# Patient Record
Sex: Male | Born: 1990 | Race: White | Hispanic: No | Marital: Single | Smoking: Current some day smoker
Health system: Southern US, Community
[De-identification: ages and names within clinical notes are randomized; demographics above are authoritative.]

## PROBLEM LIST (undated history)

## (undated) DIAGNOSIS — Z8489 Family history of other specified conditions: Secondary | ICD-10-CM

## (undated) DIAGNOSIS — S42209A Unspecified fracture of upper end of unspecified humerus, initial encounter for closed fracture: Secondary | ICD-10-CM

## (undated) DIAGNOSIS — S92912A Unspecified fracture of left toe(s), initial encounter for closed fracture: Secondary | ICD-10-CM

## (undated) DIAGNOSIS — I8390 Asymptomatic varicose veins of unspecified lower extremity: Secondary | ICD-10-CM

## (undated) HISTORY — PX: FRACTURE SURGERY: SHX138

## (undated) HISTORY — PX: WISDOM TOOTH EXTRACTION: SHX21

---
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
## 2014-01-04 ENCOUNTER — Encounter (HOSPITAL_COMMUNITY)
Admission: RE | Admit: 2014-01-04 | Discharge: 2014-01-04 | Disposition: A | Discharge status: Home / Self Care | Payer: PRIVATE HEALTH INSURANCE | Source: Ambulatory Visit | Attending: Orthopedic Surgery | Admitting: Orthopedic Surgery

## 2014-01-04 ENCOUNTER — Encounter (HOSPITAL_COMMUNITY): Payer: Self-pay

## 2014-01-04 DIAGNOSIS — S46819A Strain of other muscles, fascia and tendons at shoulder and upper arm level, unspecified arm, initial encounter: Secondary | ICD-10-CM | POA: Diagnosis not present

## 2014-01-04 DIAGNOSIS — Z5333 Arthroscopic surgical procedure converted to open procedure: Secondary | ICD-10-CM | POA: Diagnosis not present

## 2014-01-04 DIAGNOSIS — S43499A Other sprain of unspecified shoulder joint, initial encounter: Secondary | ICD-10-CM | POA: Diagnosis not present

## 2014-01-04 DIAGNOSIS — S42213A Unspecified displaced fracture of surgical neck of unspecified humerus, initial encounter for closed fracture: Secondary | ICD-10-CM | POA: Diagnosis present

## 2014-01-04 DIAGNOSIS — F17200 Nicotine dependence, unspecified, uncomplicated: Secondary | ICD-10-CM | POA: Diagnosis not present

## 2014-01-04 HISTORY — DX: Family history of other specified conditions: Z84.89

## 2014-01-04 HISTORY — DX: Unspecified fracture of left toe(s), initial encounter for closed fracture: S92.912A

## 2014-01-04 HISTORY — DX: Asymptomatic varicose veins of unspecified lower extremity: I83.90

## 2014-01-04 HISTORY — DX: Unspecified fracture of upper end of unspecified humerus, initial encounter for closed fracture: S42.209A

## 2014-01-04 NOTE — Pre-Procedure Instructions (Signed)
[name]  01/04/2014   Your procedure is scheduled on:  Monday, January 07, 2014 at 2:30 PM  [facility] to [facility] at 12:30 PM.  Call this number if you have problems the morning of surgery: [phone]   Remember:   Do not eat food or drink liquids after midnight Sunday, January 06, 2014   Take these medicines the morning of surgery with A SIP OF WATER:  If needed: HYDROcodone  (NORCO/VICODIN) for pain Stop taking Aspirin, vitamins, and herbal medications. Do not take any NSAIDs ie: Ibuprofen, Advil, Naproxen or any medication containing Aspirin.  Do not wear jewelry, make-up or nail polish.  Do not wear lotions, powders, or perfumes. You may NOT wear deodorant.  Do not shave 48 hours prior to surgery. Men may shave face and neck.  Do not bring valuables to the hospital.  [facility] is not responsible for any belongings or valuables.               Contacts, dentures or bridgework may not be worn into surgery.  Leave suitcase in the car. After surgery it may be brought to your room.  For patients admitted to the hospital, discharge time is determined by your treatment team.               Patients discharged the day of surgery will not be allowed to drive home.  Name and phone number of your driver:   Special Instructions:  Special Instructions:Special Instructions: [facility] - Preparing for Surgery  Before surgery, you can play an important role.  Because skin is not sterile, your skin needs to be as free of germs as possible.  You can reduce the number of germs on you skin by washing with CHG (chlorahexidine gluconate) soap before surgery.  CHG is an antiseptic cleaner which kills germs and bonds with the skin to continue killing germs even after washing.  Please DO NOT use if you have an allergy to CHG or antibacterial soaps.  If your skin becomes reddened/irritated stop using the CHG and inform your nurse when you arrive at Short Stay.  Do not shave  (including legs and underarms) for at least 48 hours prior to the first CHG shower.  You may shave your face.  Please follow these instructions carefully:   1.  Shower with CHG Soap the night before surgery and the morning of Surgery.  2.  If you choose to wash your hair, wash your hair first as usual with your normal shampoo.  3.  After you shampoo, rinse your hair and body thoroughly to remove the Shampoo.  4.  Use CHG as you would any other liquid soap.  You can apply chg directly  to the skin and wash gently with scrungie or a clean washcloth.  5.  Apply the CHG Soap to your body ONLY FROM THE NECK DOWN.  Do not use on open wounds or open sores.  Avoid contact with your eyes, ears, mouth and genitals (private parts).  Wash genitals (private parts) with your normal soap.  6.  Wash thoroughly, paying special attention to the area where your surgery will be performed.  7.  Thoroughly rinse your body with warm water from the neck down.  8.  DO NOT shower/wash with your normal soap after using and rinsing off the CHG Soap.  9.  Pat yourself dry with a clean towel.            10 .  Wear  clean pajamas.            11.  Place clean sheets on your bed the night of your first shower and do not sleep with pets.  Day of Surgery  Do not apply any lotions/deodorants the morning of surgery.  Please wear clean clothes to the hospital/surgery center.   Please read over the following fact sheets that you were given: Pain Booklet, Coughing and Deep Breathing and Surgical Site Infection Prevention
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
## 2014-01-07 ENCOUNTER — Encounter (HOSPITAL_COMMUNITY): Payer: PRIVATE HEALTH INSURANCE | Admitting: Vascular Surgery

## 2014-01-07 ENCOUNTER — Observation Stay (HOSPITAL_COMMUNITY): Payer: PRIVATE HEALTH INSURANCE

## 2014-01-07 ENCOUNTER — Ambulatory Visit (HOSPITAL_COMMUNITY): Payer: PRIVATE HEALTH INSURANCE | Admitting: Anesthesiology

## 2014-01-07 ENCOUNTER — Observation Stay (HOSPITAL_COMMUNITY)
Admission: RE | Admit: 2014-01-07 | Discharge: 2014-01-09 | Disposition: A | Discharge status: Home / Self Care | Payer: PRIVATE HEALTH INSURANCE | Source: Ambulatory Visit | Attending: Orthopedic Surgery | Admitting: Orthopedic Surgery

## 2014-01-07 ENCOUNTER — Encounter (HOSPITAL_COMMUNITY)
Admission: RE | Disposition: A | Discharge status: Home / Self Care | Payer: Self-pay | Source: Ambulatory Visit | Attending: Orthopedic Surgery

## 2014-01-07 ENCOUNTER — Encounter (HOSPITAL_COMMUNITY): Payer: Self-pay | Admitting: Anesthesiology

## 2014-01-07 DIAGNOSIS — F17200 Nicotine dependence, unspecified, uncomplicated: Secondary | ICD-10-CM | POA: Insufficient documentation

## 2014-01-07 DIAGNOSIS — S43499A Other sprain of unspecified shoulder joint, initial encounter: Secondary | ICD-10-CM | POA: Insufficient documentation

## 2014-01-07 DIAGNOSIS — S42213A Unspecified displaced fracture of surgical neck of unspecified humerus, initial encounter for closed fracture: Principal | ICD-10-CM | POA: Insufficient documentation

## 2014-01-07 DIAGNOSIS — Z5333 Arthroscopic surgical procedure converted to open procedure: Secondary | ICD-10-CM | POA: Insufficient documentation

## 2014-01-07 DIAGNOSIS — S46819A Strain of other muscles, fascia and tendons at shoulder and upper arm level, unspecified arm, initial encounter: Secondary | ICD-10-CM

## 2014-01-07 DIAGNOSIS — S42209A Unspecified fracture of upper end of unspecified humerus, initial encounter for closed fracture: Secondary | ICD-10-CM

## 2014-01-07 DIAGNOSIS — S42202A Unspecified fracture of upper end of left humerus, initial encounter for closed fracture: Secondary | ICD-10-CM

## 2014-01-07 HISTORY — PX: SHOULDER ARTHROSCOPY: SHX128

## 2014-01-07 HISTORY — PX: ORIF HUMERUS FRACTURE: SHX2126

## 2014-01-07 LAB — BASIC METABOLIC PANEL
ANION GAP: 13 (ref 5–15)
BUN: 11 mg/dL (ref 6–23)
CHLORIDE: 97 meq/L (ref 96–112)
CO2: 27 meq/L (ref 19–32)
Calcium: 9.3 mg/dL (ref 8.4–10.5)
Creatinine, Ser: 0.85 mg/dL (ref 0.50–1.35)
GFR calc Af Amer: >90 mL/min (ref >90)
GFR calc non Af Amer: >90 mL/min (ref >90)
Glucose, Bld: 90 mg/dL (ref 70–99)
POTASSIUM: 3.8 meq/L (ref 3.7–5.3)
SODIUM: 137 meq/L (ref 137–147)

## 2014-01-07 LAB — CBC
HCT: 38.4 % — ABNORMAL LOW (ref 39.0–52.0)
HEMOGLOBIN: 13.6 g/dL (ref 13.0–17.0)
MCH: 31 pg (ref 26.0–34.0)
MCHC: 35.4 g/dL (ref 30.0–36.0)
MCV: 87.5 fL (ref 78.0–100.0)
PLATELETS: 232 10*3/uL (ref 150–400)
RBC: 4.39 MIL/uL (ref 4.22–5.81)
RDW: 11.8 % (ref 11.5–15.5)
WBC: 8.4 10*3/uL (ref 4.0–10.5)

## 2014-01-07 SURGERY — OPEN REDUCTION INTERNAL FIXATION (ORIF) PROXIMAL HUMERUS FRACTURE
Anesthesia: General | Site: Shoulder | Laterality: Left

## 2014-01-07 MED ORDER — HYDROMORPHONE HCL PF 1 MG/ML IJ SOLN: 0.2500 mg | INTRAMUSCULAR | Status: DC | PRN

## 2014-01-07 MED ORDER — ONDANSETRON HCL 4 MG PO TABS: 4.0000 mg | ORAL_TABLET | Freq: Four times a day (QID) | ORAL | Status: DC | PRN

## 2014-01-07 MED ORDER — METOCLOPRAMIDE HCL 5 MG/ML IJ SOLN: 5.0000 mg | Freq: Three times a day (TID) | INTRAMUSCULAR | Status: DC | PRN

## 2014-01-07 MED ORDER — ONDANSETRON HCL 4 MG/2ML IJ SOLN
INTRAMUSCULAR | Status: AC
  Filled 2014-01-07: qty 2

## 2014-01-07 MED ORDER — METOCLOPRAMIDE HCL 5 MG/ML IJ SOLN
INTRAMUSCULAR | Status: AC
  Administered 2014-01-07: 10 mg
  Filled 2014-01-07: qty 2

## 2014-01-07 MED ORDER — NEOSTIGMINE METHYLSULFATE 10 MG/10ML IV SOLN
INTRAVENOUS | Status: DC | PRN
  Administered 2014-01-07: 3 mg via INTRAVENOUS

## 2014-01-07 MED ORDER — ACETAMINOPHEN 650 MG RE SUPP: 650.0000 mg | Freq: Four times a day (QID) | RECTAL | Status: DC | PRN

## 2014-01-07 MED ORDER — ONDANSETRON HCL 4 MG/2ML IJ SOLN
4.0000 mg | Freq: Four times a day (QID) | INTRAMUSCULAR | Status: DC | PRN
  Administered 2014-01-09: 4 mg via INTRAVENOUS
  Filled 2014-01-07: qty 2

## 2014-01-07 MED ORDER — METOCLOPRAMIDE HCL 10 MG PO TABS
5.0000 mg | ORAL_TABLET | Freq: Three times a day (TID) | ORAL | Status: DC | PRN
  Administered 2014-01-08 (×2): 10 mg via ORAL
  Filled 2014-01-07 (×2): qty 1

## 2014-01-07 MED ORDER — LIDOCAINE HCL (CARDIAC) 20 MG/ML IV SOLN
INTRAVENOUS | Status: DC | PRN
  Administered 2014-01-07: 60 mg via INTRAVENOUS

## 2014-01-07 MED ORDER — MENTHOL 3 MG MT LOZG
1.0000 | LOZENGE | OROMUCOSAL | Status: DC | PRN
  Filled 2014-01-07: qty 9

## 2014-01-07 MED ORDER — METOCLOPRAMIDE HCL 10 MG/10ML PO SOLN: 10.0000 mg | Freq: Three times a day (TID) | ORAL | Status: DC

## 2014-01-07 MED ORDER — METHOCARBAMOL 500 MG PO TABS: 500.0000 mg | ORAL_TABLET | Freq: Three times a day (TID) | ORAL | Status: DC | PRN

## 2014-01-07 MED ORDER — PROPOFOL 10 MG/ML IV BOLUS
INTRAVENOUS | Status: DC | PRN
  Administered 2014-01-07: 100 mg via INTRAVENOUS

## 2014-01-07 MED ORDER — GLYCOPYRROLATE 0.2 MG/ML IJ SOLN
INTRAMUSCULAR | Status: AC
  Filled 2014-01-07: qty 3

## 2014-01-07 MED ORDER — FENTANYL CITRATE 0.05 MG/ML IJ SOLN
INTRAMUSCULAR | Status: AC
  Administered 2014-01-07: 50 ug
  Filled 2014-01-07: qty 2

## 2014-01-07 MED ORDER — HYDROMORPHONE HCL PF 1 MG/ML IJ SOLN
1.0000 mg | INTRAMUSCULAR | Status: DC | PRN
  Administered 2014-01-07 – 2014-01-09 (×14): 1 mg via INTRAVENOUS
  Filled 2014-01-07 (×13): qty 1

## 2014-01-07 MED ORDER — OXYCODONE-ACETAMINOPHEN 5-325 MG PO TABS
1.0000 | ORAL_TABLET | ORAL | Status: DC | PRN
  Administered 2014-01-07: 2 via ORAL
  Administered 2014-01-08 (×2): 1 via ORAL
  Filled 2014-01-07: qty 2
  Filled 2014-01-07 (×2): qty 1

## 2014-01-07 MED ORDER — BUPIVACAINE-EPINEPHRINE (PF) 0.25% -1:200000 IJ SOLN
INTRAMUSCULAR | Status: DC | PRN
  Administered 2014-01-07: 15 mL

## 2014-01-07 MED ORDER — SODIUM CHLORIDE 0.9 % IR SOLN
Status: DC | PRN
  Administered 2014-01-07: 3000 mL

## 2014-01-07 MED ORDER — PROPOFOL 10 MG/ML IV BOLUS
INTRAVENOUS | Status: AC
  Filled 2014-01-07: qty 20

## 2014-01-07 MED ORDER — BACITRACIN 500 UNIT/GM EX OINT
1.0000 "application " | TOPICAL_OINTMENT | Freq: Every day | CUTANEOUS | Status: DC
  Filled 2014-01-07 (×3): qty 0.9

## 2014-01-07 MED ORDER — CHLORHEXIDINE GLUCONATE 4 % EX LIQD
60.0000 mL | Freq: Once | CUTANEOUS | Status: DC
  Filled 2014-01-07: qty 60

## 2014-01-07 MED ORDER — BUPIVACAINE-EPINEPHRINE (PF) 0.25% -1:200000 IJ SOLN
INTRAMUSCULAR | Status: AC
  Filled 2014-01-07: qty 30

## 2014-01-07 MED ORDER — LIDOCAINE HCL (CARDIAC) 20 MG/ML IV SOLN
INTRAVENOUS | Status: AC
  Filled 2014-01-07: qty 5

## 2014-01-07 MED ORDER — LACTATED RINGERS IV SOLN
INTRAVENOUS | Status: DC
  Administered 2014-01-07: 14:00:00 via INTRAVENOUS

## 2014-01-07 MED ORDER — HYDROMORPHONE HCL PF 1 MG/ML IJ SOLN
INTRAMUSCULAR | Status: AC
  Filled 2014-01-07: qty 1

## 2014-01-07 MED ORDER — CEFAZOLIN SODIUM-DEXTROSE 2-3 GM-% IV SOLR
INTRAVENOUS | Status: AC
  Filled 2014-01-07: qty 50

## 2014-01-07 MED ORDER — CEFAZOLIN SODIUM-DEXTROSE 2-3 GM-% IV SOLR
2.0000 g | INTRAVENOUS | Status: AC
  Administered 2014-01-07: 2 g via INTRAVENOUS

## 2014-01-07 MED ORDER — PROMETHAZINE HCL 50 MG PO TABS: 25.0000 mg | ORAL_TABLET | Freq: Four times a day (QID) | ORAL | Status: DC | PRN

## 2014-01-07 MED ORDER — OXYCODONE-ACETAMINOPHEN 5-325 MG PO TABS: 1.0000 | ORAL_TABLET | ORAL | Status: DC | PRN

## 2014-01-07 MED ORDER — ONDANSETRON HCL 4 MG/2ML IJ SOLN
4.0000 mg | Freq: Once | INTRAMUSCULAR | Status: AC | PRN
  Administered 2014-01-07: 4 mg via INTRAVENOUS

## 2014-01-07 MED ORDER — METHOCARBAMOL 1000 MG/10ML IJ SOLN
500.0000 mg | Freq: Four times a day (QID) | INTRAVENOUS | Status: DC | PRN
  Filled 2014-01-07: qty 5

## 2014-01-07 MED ORDER — LACTATED RINGERS IV SOLN
INTRAVENOUS | Status: DC | PRN
  Administered 2014-01-07 (×2): via INTRAVENOUS

## 2014-01-07 MED ORDER — PHENOL 1.4 % MT LIQD: 1.0000 | OROMUCOSAL | Status: DC | PRN

## 2014-01-07 MED ORDER — MIDAZOLAM HCL 2 MG/2ML IJ SOLN
INTRAMUSCULAR | Status: AC
  Administered 2014-01-07: 2 mg
  Filled 2014-01-07: qty 2

## 2014-01-07 MED ORDER — 0.9 % SODIUM CHLORIDE (POUR BTL) OPTIME
TOPICAL | Status: DC | PRN
  Administered 2014-01-07: 1000 mL

## 2014-01-07 MED ORDER — NEOSTIGMINE METHYLSULFATE 10 MG/10ML IV SOLN
INTRAVENOUS | Status: AC
  Filled 2014-01-07: qty 1

## 2014-01-07 MED ORDER — ONDANSETRON HCL 4 MG/2ML IJ SOLN
INTRAMUSCULAR | Status: AC
Start: 2014-01-07 — End: 2014-01-08
  Filled 2014-01-07: qty 2

## 2014-01-07 MED ORDER — BISACODYL 10 MG RE SUPP
10.0000 mg | Freq: Every day | RECTAL | Status: DC | PRN
  Administered 2014-01-08: 10 mg via RECTAL
  Filled 2014-01-07: qty 1

## 2014-01-07 MED ORDER — GLYCOPYRROLATE 0.2 MG/ML IJ SOLN
INTRAMUSCULAR | Status: DC | PRN
  Administered 2014-01-07: 0.4 mg via INTRAVENOUS

## 2014-01-07 MED ORDER — MEPERIDINE HCL 25 MG/ML IJ SOLN
6.2500 mg | INTRAMUSCULAR | Status: DC | PRN
  Administered 2014-01-07: 12.5 mg via INTRAVENOUS

## 2014-01-07 MED ORDER — OXYCODONE HCL 5 MG PO TABS: 5.0000 mg | ORAL_TABLET | Freq: Once | ORAL | Status: DC | PRN

## 2014-01-07 MED ORDER — KETOROLAC TROMETHAMINE 30 MG/ML IJ SOLN
INTRAMUSCULAR | Status: AC
  Filled 2014-01-07: qty 1

## 2014-01-07 MED ORDER — ACETAMINOPHEN 325 MG PO TABS: 650.0000 mg | ORAL_TABLET | Freq: Four times a day (QID) | ORAL | Status: DC | PRN

## 2014-01-07 MED ORDER — METHOCARBAMOL 500 MG PO TABS
500.0000 mg | ORAL_TABLET | Freq: Four times a day (QID) | ORAL | Status: DC | PRN
  Administered 2014-01-08 – 2014-01-09 (×6): 500 mg via ORAL
  Filled 2014-01-07 (×6): qty 1

## 2014-01-07 MED ORDER — FENTANYL CITRATE 0.05 MG/ML IJ SOLN
INTRAMUSCULAR | Status: DC | PRN
  Administered 2014-01-07: 100 ug via INTRAVENOUS
  Administered 2014-01-07 (×3): 50 ug via INTRAVENOUS

## 2014-01-07 MED ORDER — OXYCODONE HCL 5 MG/5ML PO SOLN: 5.0000 mg | Freq: Once | ORAL | Status: DC | PRN

## 2014-01-07 MED ORDER — DIPHENHYDRAMINE HCL 25 MG PO CAPS: 25.0000 mg | ORAL_CAPSULE | Freq: Four times a day (QID) | ORAL | Status: DC | PRN

## 2014-01-07 MED ORDER — BUPIVACAINE-EPINEPHRINE (PF) 0.5% -1:200000 IJ SOLN
INTRAMUSCULAR | Status: DC | PRN
  Administered 2014-01-07: 30 mL via PERINEURAL

## 2014-01-07 MED ORDER — FENTANYL CITRATE 0.05 MG/ML IJ SOLN
INTRAMUSCULAR | Status: AC
  Filled 2014-01-07: qty 5

## 2014-01-07 MED ORDER — ROCURONIUM BROMIDE 100 MG/10ML IV SOLN
INTRAVENOUS | Status: DC | PRN
  Administered 2014-01-07: 50 mg via INTRAVENOUS

## 2014-01-07 MED ORDER — MEPERIDINE HCL 25 MG/ML IJ SOLN
INTRAMUSCULAR | Status: AC
  Filled 2014-01-07: qty 1

## 2014-01-07 MED ORDER — METOCLOPRAMIDE HCL 10 MG/10ML PO SOLN: 10.0000 mg | Freq: Once | ORAL | Status: DC

## 2014-01-07 MED ORDER — KETOROLAC TROMETHAMINE 30 MG/ML IJ SOLN
INTRAMUSCULAR | Status: DC | PRN
  Administered 2014-01-07: 30 mg via INTRAVENOUS

## 2014-01-07 MED ORDER — MIDAZOLAM HCL 2 MG/2ML IJ SOLN
INTRAMUSCULAR | Status: AC
  Filled 2014-01-07: qty 2

## 2014-01-07 MED ORDER — SODIUM CHLORIDE 0.9 % IV SOLN: INTRAVENOUS | Status: DC

## 2014-01-07 MED ORDER — ONDANSETRON HCL 4 MG/2ML IJ SOLN
INTRAMUSCULAR | Status: DC | PRN
  Administered 2014-01-07: 4 mg via INTRAVENOUS

## 2014-01-07 MED ORDER — CEFAZOLIN SODIUM-DEXTROSE 2-3 GM-% IV SOLR
2.0000 g | Freq: Four times a day (QID) | INTRAVENOUS | Status: AC
  Administered 2014-01-07 – 2014-01-08 (×3): 2 g via INTRAVENOUS
  Filled 2014-01-07 (×3): qty 50

## 2014-01-07 SURGICAL SUPPLY — 87 items
ANCHOR ALL-SUT FLEX 1.3 Y-KNOT (Anchor) ×3 IMPLANT
BIT DRILL 1.3M DISPOSABLE (BIT) ×3 IMPLANT
BIT DRILL 4 FAST STEP (BIT) ×3 IMPLANT
BIT DRILL 4 LONG FAST STEP (BIT) ×3 IMPLANT
BIT DRILL 4 SHORT FAST STEP (BIT) ×3 IMPLANT
BLADE CUDA 4.2 (BLADE) IMPLANT
BLADE SURG 11 STRL SS (BLADE) ×3 IMPLANT
BUR OVAL 4.0 (BURR) ×3 IMPLANT
CLOSURE WOUND 1/2 X4 (GAUZE/BANDAGES/DRESSINGS) ×1
COVER SURGICAL LIGHT HANDLE (MISCELLANEOUS) ×3 IMPLANT
DRAPE INCISE IOBAN 66X45 STRL (DRAPES) ×6 IMPLANT
DRAPE STERI 35X30 U-POUCH (DRAPES) ×3 IMPLANT
DRAPE U-SHAPE 47X51 STRL (DRAPES) ×3 IMPLANT
DRESSING ADAPTIC 1/2  N-ADH (PACKING) ×3 IMPLANT
DRILL BIT 2.8MM DB28 (MISCELLANEOUS) ×3 IMPLANT
DRSG EMULSION OIL 3X3 NADH (GAUZE/BANDAGES/DRESSINGS) ×6 IMPLANT
DRSG PAD ABDOMINAL 8X10 ST (GAUZE/BANDAGES/DRESSINGS) ×6 IMPLANT
DURAPREP 26ML APPLICATOR (WOUND CARE) ×3 IMPLANT
ELECT NEEDLE TIP 2.8 STRL (NEEDLE) ×3 IMPLANT
ELECT REM PT RETURN 9FT ADLT (ELECTROSURGICAL) ×3
ELECTRODE REM PT RTRN 9FT ADLT (ELECTROSURGICAL) ×1 IMPLANT
GAUZE SPONGE 4X4 12PLY STRL (GAUZE/BANDAGES/DRESSINGS) ×3 IMPLANT
GLOVE BIOGEL PI ORTHO PRO 7.5 (GLOVE) ×2
GLOVE BIOGEL PI ORTHO PRO SZ8 (GLOVE) ×2
GLOVE ORTHO TXT STRL SZ7.5 (GLOVE) ×3 IMPLANT
GLOVE PI ORTHO PRO STRL 7.5 (GLOVE) ×1 IMPLANT
GLOVE PI ORTHO PRO STRL SZ8 (GLOVE) ×1 IMPLANT
GLOVE SURG ORTHO 8.5 STRL (GLOVE) ×3 IMPLANT
GOWN STRL REUS W/ TWL XL LVL3 (GOWN DISPOSABLE) ×4 IMPLANT
GOWN STRL REUS W/TWL XL LVL3 (GOWN DISPOSABLE) ×8
HI-FI #2 SUTURE ×3 IMPLANT
KIT BASIN OR (CUSTOM PROCEDURE TRAY) ×3 IMPLANT
KIT ROOM TURNOVER OR (KITS) ×3 IMPLANT
MANIFOLD NEPTUNE II (INSTRUMENTS) ×3 IMPLANT
NDL SUT 6 .5 CRC .975X.05 MAYO (NEEDLE) ×1 IMPLANT
NEEDLE 22X1 1/2 (OR ONLY) (NEEDLE) ×3 IMPLANT
NEEDLE HYPO 25GX1X1/2 BEV (NEEDLE) ×3 IMPLANT
NEEDLE MAYO TAPER (NEEDLE) ×2
NEEDLE SPNL 18GX3.5 QUINCKE PK (NEEDLE) ×3 IMPLANT
NS IRRIG 1000ML POUR BTL (IV SOLUTION) ×3 IMPLANT
PACK SHOULDER (CUSTOM PROCEDURE TRAY) ×3 IMPLANT
PAD ABD 8X10 STRL (GAUZE/BANDAGES/DRESSINGS) ×3 IMPLANT
PAD ARMBOARD 7.5X6 YLW CONV (MISCELLANEOUS) ×6 IMPLANT
PEG STND 4.0X35MM (Orthopedic Implant) ×3 IMPLANT
PEG STND 4.0X37.5MM (Orthopedic Implant) ×3 IMPLANT
PEG STND 4.0X40MM (Orthopedic Implant) ×6 IMPLANT
PEG STND 4.0X42.5MM (Orthopedic Implant) ×3 IMPLANT
PEG THREADED 4.0X45.0MM (Orthopedic Implant) ×6 IMPLANT
PEGSTD 4.0X35MM (Orthopedic Implant) ×1 IMPLANT
PEGSTD 4.0X37.5MM (Orthopedic Implant) ×1 IMPLANT
PEGSTD 4.0X40MM (Orthopedic Implant) ×2 IMPLANT
PEGSTD 4.0X42.5MM (Orthopedic Implant) ×1 IMPLANT
PIN GUIDE SHOULDER 2.0MM (PIN) ×3 IMPLANT
PLATE SHOULDER S3 3HOLE LT (Plate) ×3 IMPLANT
RESECTOR FULL RADIUS 4.2MM (BLADE) ×3 IMPLANT
SCREW MULTIDIR 3.8X26 HUMRL (Screw) ×3 IMPLANT
SCREW MULTIDIR 3.8X30 HUMRL (Screw) ×6 IMPLANT
SCREW MULTIDIR 3.8X34 HUMRL (Screw) ×3 IMPLANT
SET ARTHROSCOPY TUBING (MISCELLANEOUS) ×2
SET ARTHROSCOPY TUBING LN (MISCELLANEOUS) ×1 IMPLANT
SLING ARM FOAM STRAP LRG (SOFTGOODS) ×3 IMPLANT
SLING ARM LRG ADULT FOAM STRAP (SOFTGOODS) ×3 IMPLANT
SLING ARM MED ADULT FOAM STRAP (SOFTGOODS) IMPLANT
SPONGE GAUZE 4X4 12PLY STER LF (GAUZE/BANDAGES/DRESSINGS) ×3 IMPLANT
SPONGE LAP 18X18 X RAY DECT (DISPOSABLE) ×3 IMPLANT
SPONGE LAP 4X18 X RAY DECT (DISPOSABLE) ×6 IMPLANT
STRIP CLOSURE SKIN 1/2X4 (GAUZE/BANDAGES/DRESSINGS) ×2 IMPLANT
SUCTION FRAZIER TIP 10 FR DISP (SUCTIONS) ×3 IMPLANT
SUT BONE WAX W31G (SUTURE) ×3 IMPLANT
SUT FIBERWIRE #2 38 T-5 BLUE (SUTURE)
SUT MNCRL AB 4-0 PS2 18 (SUTURE) ×3 IMPLANT
SUT VIC AB 0 CT1 27 (SUTURE) ×8
SUT VIC AB 0 CT1 27XBRD ANBCTR (SUTURE) ×4 IMPLANT
SUT VIC AB 0 CT2 27 (SUTURE) IMPLANT
SUT VIC AB 2-0 CT1 27 (SUTURE) ×4
SUT VIC AB 2-0 CT1 TAPERPNT 27 (SUTURE) ×2 IMPLANT
SUT VICRYL 0 CT 1 36IN (SUTURE) ×3 IMPLANT
SUTURE FIBERWR #2 38 T-5 BLUE (SUTURE) IMPLANT
SYR CONTROL 10ML LL (SYRINGE) ×3 IMPLANT
SYRINGE CONTROL L 12CC (SYRINGE) ×3 IMPLANT
TAPE CLOTH SURG 4X10 WHT LF (GAUZE/BANDAGES/DRESSINGS) ×3 IMPLANT
TOWEL OR 17X24 6PK STRL BLUE (TOWEL DISPOSABLE) ×3 IMPLANT
TOWEL OR 17X26 10 PK STRL BLUE (TOWEL DISPOSABLE) ×3 IMPLANT
TUBE CONNECTING 12'X1/4 (SUCTIONS) ×1
TUBE CONNECTING 12X1/4 (SUCTIONS) ×2 IMPLANT
WAND HAND CNTRL MULTIVAC 90 (MISCELLANEOUS) ×3 IMPLANT
WATER STERILE IRR 1000ML POUR (IV SOLUTION) ×3 IMPLANT

## 2014-01-07 NOTE — Interval H&P Note (Signed)
History and Physical Interval Note:  01/07/2014 2:09 PM  [name]  has presented today for surgery, with the diagnosis of LEFT PROXIMAL HUMUS FRACTURE  The various methods of treatment have been discussed with the patient and family. After consideration of risks, benefits and other options for treatment, the patient has consented to  Procedure(s): OPEN REDUCTION INTERNAL FIXATION (ORIF) LEFT  PROXIMAL HUMERUS FRACTURE (Left) ARTHROSCOPY SHOULDER (Left) as a surgical intervention .  The patient's history has been reviewed, patient examined, no change in status, stable for surgery.  I have reviewed the patient's chart and labs.  Questions were answered to the patient's satisfaction.     Anjelique Makar,STEVEN R

## 2014-01-07 NOTE — Transfer of Care (Signed)
Immediate Anesthesia Transfer of Care Note  Patient: [name]  Procedure(s) Performed: Procedure(s): OPEN REDUCTION INTERNAL FIXATION (ORIF) LEFT  PROXIMAL HUMERUS FRACTURE (Left) ARTHROSCOPY SHOULDER (Left)  Patient Location: PACU  Anesthesia Type:General  Level of Consciousness: awake, alert  and oriented  Airway & Oxygen Therapy: Patient Spontanous Breathing and Patient connected to nasal cannula oxygen  Post-op Assessment: Report given to PACU RN and Post -op Vital signs reviewed and stable  Post vital signs: Reviewed and stable  Complications: No apparent anesthesia complications

## 2014-01-07 NOTE — Anesthesia Preprocedure Evaluation (Addendum)
Anesthesia Evaluation  Patient identified by MRN, date of birth, ID band Patient awake    Reviewed: Allergy & Precautions, H&P , NPO status , Patient's Chart, lab work & pertinent test results  History of Anesthesia Complications (+) Family history of anesthesia reaction  Airway Mallampati: I TM Distance: >3 FB Neck ROM: Full    Dental  (+) Teeth Intact, Dental Advisory Given   Pulmonary Current Smoker,          Cardiovascular     Neuro/Psych    GI/Hepatic   Endo/Other    Renal/GU      Musculoskeletal   Abdominal   Peds  Hematology   Anesthesia Other Findings   Reproductive/Obstetrics                          Anesthesia Physical Anesthesia Plan  ASA: II  Anesthesia Plan: General   Post-op Pain Management:    Induction: Intravenous  Airway Management Planned: Oral ETT  Additional Equipment:   Intra-op Plan:   Post-operative Plan: Extubation in OR  Informed Consent: I have reviewed the patients History and Physical, chart, labs and discussed the procedure including the risks, benefits and alternatives for the proposed anesthesia with the patient or authorized representative who has indicated his/her understanding and acceptance.   Dental advisory given  Plan Discussed with: Surgeon and CRNA  Anesthesia Plan Comments:        Anesthesia Quick Evaluation

## 2014-01-07 NOTE — Discharge Instructions (Signed)
Ice to the shoulder as much as possible  Keep the arm in the sling while up moving around.  Do Not push, pull, or lift with the left arm.  Ok to move the left arm, hand to face for example, use the right arm to assist as needed.  Ok to use the hand and arm for activities of daily living  Do exercises every hour while awake - lap slides, door hinge exercises and pendulums  Follow up with Dr Ranell PatrickNorris in one week, [facility].

## 2014-01-07 NOTE — Anesthesia Procedure Notes (Addendum)
Anesthesia Regional Block:  Interscalene brachial plexus block  Pre-Anesthetic Checklist: ,, timeout performed, Correct Patient, Correct Site, Correct Laterality, Correct Procedure, Correct Position, site marked, Risks and benefits discussed,  Surgical consent,  Pre-op evaluation,  At surgeon's request and post-op pain management  Laterality: Left  Prep: chloraprep       Needles:  Injection technique: Single-shot  Needle Type: Echogenic Stimulator Needle     Needle Length: 9cm 9 cm Needle Gauge: 21 and 21 G    Additional Needles:  Procedures: ultrasound guided (picture in chart) and nerve stimulator Interscalene brachial plexus block  Nerve Stimulator or Paresthesia:  Response: 0.4 mA,   Additional Responses:   Narrative:  Start time: 01/07/2014 2:00 PM End time: 01/07/2014 2:10 PM Injection made incrementally with aspirations every 5 mL.  Performed by: Personally  Anesthesiologist: Arta BruceKevin Ossey MD  Additional Notes: Monitors applied. Patient sedated. Sterile prep and drape,hand hygiene and sterile gloves were used. Relevant anatomy identified.Needle position confirmed.Local anesthetic injected incrementally after negative aspiration. Local anesthetic spread visualized around nerve(s). Vascular puncture avoided. No complications. Image printed for medical record.The patient tolerated the procedure well.        Procedure Name: Intubation Date/Time: 01/07/2014 2:39 PM Performed by: Leonel Ramsay'LAUGHLIN, Yassmin Binegar H Pre-anesthesia Checklist: Patient identified, Timeout performed, Emergency Drugs available, Suction available and Patient being monitored Patient Re-evaluated:Patient Re-evaluated prior to inductionOxygen Delivery Method: Circle system utilized Preoxygenation: Pre-oxygenation with 100% oxygen Intubation Type: IV induction Ventilation: Mask ventilation without difficulty Laryngoscope Size: Mac and 3 Grade View: Grade I Tube type: Oral Tube size: 7.5 mm Number of attempts:  1 Airway Equipment and Method: Stylet Placement Confirmation: ETT inserted through vocal cords under direct vision,  positive ETCO2 and breath sounds checked- equal and bilateral Secured at: 22 cm Tube secured with: Tape Dental Injury: Teeth and Oropharynx as per pre-operative assessment

## 2014-01-07 NOTE — Anesthesia Postprocedure Evaluation (Signed)
  Anesthesia Post-op Note  Patient: [name]  Procedure(s) Performed: Procedure(s): OPEN REDUCTION INTERNAL FIXATION (ORIF) LEFT  PROXIMAL HUMERUS FRACTURE (Left) ARTHROSCOPY SHOULDER (Left)  Patient Location: PACU  Anesthesia Type:General and GA combined with regional for post-op pain  Level of Consciousness: awake, alert  and oriented  Airway and Oxygen Therapy: Patient Spontanous Breathing and Patient connected to nasal cannula oxygen  Post-op Pain: none  Post-op Assessment: Post-op Vital signs reviewed, Patient's Cardiovascular Status Stable, Respiratory Function Stable, Patent Airway and Pain level controlled  Post-op Vital Signs: stable  Last Vitals:  Filed Vitals:   01/07/14 1845  BP: 146/77  Pulse: 102  Temp:   Resp: 10    Complications: No apparent anesthesia complications

## 2014-01-07 NOTE — Brief Op Note (Signed)
01/07/2014  5:42 PM  PATIENT:  [name]  23 y.o. male  PRE-OPERATIVE DIAGNOSIS:  LEFT PROXIMAL HUMERUS FRACTURE, DISPLACED, COMMIUTED, PECTORALIS MAJOR TEAR, PROXIMAL LONG HEAD BICEPS TEAR, TENDON DISLOCATION, POSTERIOR SUPERIOR LABRAL TEAR  POST-OPERATIVE DIAGNOSIS:  LEFT PROXIMAL HUMERUS FRACTURE, DISPLACED, COMMIUTED, PECTORALIS MAJOR TEAR, PROXIMAL LONG HEAD BICEPS TEAR, TENDON DISLOCATION, POSTERIOR SUPERIOR LABRAL TEAR    PROCEDURE:  Procedure(s): OPEN REDUCTION INTERNAL FIXATION (ORIF) LEFT  PROXIMAL HUMERUS FRACTURE (Left) ARTHROSCOPY SHOULDER (Left), LABRAL DEBRIDEMENT, BICEPS TENOTOMY AND TENODESIS IN GROOVE, PEC MAJOR PRIMARY REPAIR  SURGEON:  Surgeon(s) and Role:    * Verlee RossettiSteven R Andri Prestia, MD - Primary  PHYSICIAN ASSISTANT:   ASSISTANTS: Thea Gisthomas B Dixon, PA-C  ANESTHESIA:   regional and general  EBL:  Total I/O In: 1400 [I.V.:1400] Out: 350 [Blood:350]  BLOOD ADMINISTERED:none  DRAINS: none   LOCAL MEDICATIONS USED:  MARCAINE     SPECIMEN:  No Specimen  DISPOSITION OF SPECIMEN:  N/A  COUNTS:  YES  TOURNIQUET:  * No tourniquets in log *  DICTATION: .Other Dictation: Dictation Number [id number]  PLAN OF CARE: Admit for overnight observation  PATIENT DISPOSITION:  PACU - hemodynamically stable.   Delay start of Pharmacological VTE agent (>24hrs) due to surgical blood loss or risk of bleeding: not applicable

## 2014-01-08 DIAGNOSIS — S42213A Unspecified displaced fracture of surgical neck of unspecified humerus, initial encounter for closed fracture: Secondary | ICD-10-CM | POA: Diagnosis not present

## 2014-01-08 MED ORDER — FLEET ENEMA 7-19 GM/118ML RE ENEM
1.0000 | ENEMA | Freq: Every day | RECTAL | Status: DC | PRN
  Administered 2014-01-08: 1 via RECTAL
  Filled 2014-01-08: qty 1

## 2014-01-08 MED ORDER — DOCUSATE SODIUM 100 MG PO CAPS
100.0000 mg | ORAL_CAPSULE | Freq: Two times a day (BID) | ORAL | Status: DC
  Administered 2014-01-08 – 2014-01-09 (×2): 100 mg via ORAL
  Filled 2014-01-08 (×3): qty 1

## 2014-01-08 MED ORDER — HYDROMORPHONE HCL 2 MG PO TABS
2.0000 mg | ORAL_TABLET | ORAL | Status: DC | PRN
  Administered 2014-01-09 (×3): 2 mg via ORAL
  Filled 2014-01-08 (×4): qty 1

## 2014-01-08 MED ORDER — POLYETHYLENE GLYCOL 3350 17 G PO PACK
17.0000 g | PACK | Freq: Every day | ORAL | Status: DC
  Administered 2014-01-09: 17 g via ORAL
  Filled 2014-01-08 (×2): qty 1

## 2014-01-08 MED ORDER — HYDROCODONE-ACETAMINOPHEN 7.5-325 MG PO TABS
1.0000 | ORAL_TABLET | ORAL | Status: DC | PRN
  Administered 2014-01-08 – 2014-01-09 (×7): 2 via ORAL
  Filled 2014-01-08 (×7): qty 2

## 2014-01-08 NOTE — Progress Notes (Signed)
I have read and agree with this note.   Time in/out: [id number] Total time: 25 minutes (2TE)  Ignacia Palmaathy Aleeah Greeno, OTR/L [phone]
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
## 2014-01-08 NOTE — Progress Notes (Signed)
Orthopedics Progress Note  Subjective: Still hurting a lot this AM  Objective:  Filed Vitals:   01/08/14 0552  BP: 145/72  Pulse: 110  Temp: 97.9 F (36.6 C)  Resp: 18    General: Awake and alert  Musculoskeletal: left shoulder dressing CDI, NVI including ax nerve, sutures removed from right elbow Neurovascularly intact  Lab Results  Component Value Date   WBC 8.4 01/07/2014   HGB 13.6 01/07/2014   HCT 38.4* 01/07/2014   MCV 87.5 01/07/2014   PLT 232 01/07/2014       Component Value Date/Time   NA 137 01/07/2014 1333   K 3.8 01/07/2014 1333   CL 97 01/07/2014 1333   CO2 27 01/07/2014 1333   GLUCOSE 90 01/07/2014 1333   BUN 11 01/07/2014 1333   CREATININE 0.85 01/07/2014 1333   CALCIUM 9.3 01/07/2014 1333   GFRNONAA >90 01/07/2014 1333   GFRAA >90 01/07/2014 1333    No results found for this basename: INR, PROTIME    Assessment/Plan: POD #1 s/p Procedure(s): OPEN REDUCTION INTERNAL FIXATION (ORIF) LEFT  PROXIMAL HUMERUS FRACTURE ARTHROSCOPY SHOULDER OT, mobilization D/c possible later this evening or tomorrow  Viviann SpareSteven R. Ranell PatrickNorris, MD 01/08/2014 7:41 AM

## 2014-01-08 NOTE — Progress Notes (Signed)
UR completed 
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
## 2014-01-09 DIAGNOSIS — S42213A Unspecified displaced fracture of surgical neck of unspecified humerus, initial encounter for closed fracture: Secondary | ICD-10-CM | POA: Diagnosis not present

## 2014-01-09 MED ORDER — HYDROCODONE-ACETAMINOPHEN 7.5-325 MG PO TABS: 1.0000 | ORAL_TABLET | ORAL | Status: DC | PRN

## 2014-01-09 MED ORDER — BACITRACIN ZINC 500 UNIT/GM EX OINT
TOPICAL_OINTMENT | Freq: Every day | CUTANEOUS | Status: DC
  Administered 2014-01-09: 1 via TOPICAL
  Filled 2014-01-09: qty 28.35
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
## 2014-01-09 NOTE — Progress Notes (Signed)
Orthopedics Progress Note  Subjective: I feel better today  Objective:  Filed Vitals:   01/09/14 0447  BP: 148/80  Pulse: 114  Temp: 99.7 F (37.6 C)  Resp: 16    General: Awake and alert  Musculoskeletal: left UE NVI, dressing intact Neurovascularly intact  Lab Results  Component Value Date   WBC 8.4 01/07/2014   HGB 13.6 01/07/2014   HCT 38.4* 01/07/2014   MCV 87.5 01/07/2014   PLT 232 01/07/2014       Component Value Date/Time   NA 137 01/07/2014 1333   K 3.8 01/07/2014 1333   CL 97 01/07/2014 1333   CO2 27 01/07/2014 1333   GLUCOSE 90 01/07/2014 1333   BUN 11 01/07/2014 1333   CREATININE 0.85 01/07/2014 1333   CALCIUM 9.3 01/07/2014 1333   GFRNONAA >90 01/07/2014 1333   GFRAA >90 01/07/2014 1333    No results found for this basename: INR, PROTIME    Assessment/Plan: POD #2 s/p Procedure(s): OPEN REDUCTION INTERNAL FIXATION (ORIF) LEFT  PROXIMAL HUMERUS FRACTURE ARTHROSCOPY SHOULDER Stable overnight ready for D/C Dressing change prior to D/C  Viviann SpareSteven R. Ranell PatrickNorris, MD 01/09/2014 8:01 AM

## 2014-01-09 NOTE — Progress Notes (Signed)
D/C instructions and scripts given. Reviewed paperwork with the Pt and his mom. They both verbalized understanding of home care and outpt PT. Pt is going home with his mom at this time.

## 2014-01-09 NOTE — Discharge Summary (Signed)
Physician Discharge Summary   Patient ID: [name] MRN: [id number] DOB/AGE: 1990-07-04 23 y.o.  Admit date: 01/07/2014 Discharge date: 01/09/2014  Admission Diagnoses:  Active Problems:   Proximal humerus fracture   Discharge Diagnoses:  Same   Surgeries: Procedure(s): OPEN REDUCTION INTERNAL FIXATION (ORIF) LEFT  PROXIMAL HUMERUS FRACTURE ARTHROSCOPY SHOULDER on 01/07/2014   Consultants: PT/OT  Discharged Condition: Stable  Hospital Course: [name] is an 23 y.o. male who was admitted 01/07/2014 with a chief complaint of No chief complaint on file. , and found to have a diagnosis of <principal problem not specified>.  They were brought to the operating room on 01/07/2014 and underwent the above named procedures.    The patient had an uncomplicated hospital course and was stable for discharge.  Recent vital signs:  Filed Vitals:   01/09/14 0447  BP: 148/80  Pulse: 114  Temp: 99.7 F (37.6 C)  Resp: 16    Recent laboratory studies:  Results for orders placed during the hospital encounter of 01/07/14  CBC      Result Value Ref Range   WBC 8.4  4.0 - 10.5 K/uL   RBC 4.39  4.22 - 5.81 MIL/uL   Hemoglobin 13.6  13.0 - 17.0 g/dL   HCT 40.9 (*) 81.1 - 91.4 %   MCV 87.5  78.0 - 100.0 fL   MCH 31.0  26.0 - 34.0 pg   MCHC 35.4  30.0 - 36.0 g/dL   RDW 78.2  95.6 - 21.3 %   Platelets 232  150 - 400 K/uL  BASIC METABOLIC PANEL      Result Value Ref Range   Sodium 137  137 - 147 mEq/L   Potassium 3.8  3.7 - 5.3 mEq/L   Chloride 97  96 - 112 mEq/L   CO2 27  19 - 32 mEq/L   Glucose, Bld 90  70 - 99 mg/dL   BUN 11  6 - 23 mg/dL   Creatinine, Ser 0.86  0.50 - 1.35 mg/dL   Calcium 9.3  8.4 - 57.8 mg/dL   GFR calc non Af Amer >90  >90 mL/min   GFR calc Af Amer >90  >90 mL/min   Anion gap 13  5 - 15    Discharge Medications:     Medication List    STOP taking these medications       HYDROcodone-acetaminophen 5-325 MG per tablet  Commonly known as:   NORCO/VICODIN  Replaced by:  HYDROcodone-acetaminophen 7.5-325 MG per tablet      TAKE these medications       bacitracin 500 UNIT/GM ointment  Apply 1 application topically daily.     diphenhydrAMINE 25 mg capsule  Commonly known as:  BENADRYL  Take 25 mg by mouth every 6 (six) hours as needed for itching.     HYDROcodone-acetaminophen 7.5-325 MG per tablet  Commonly known as:  NORCO  Take 1-2 tablets by mouth every 4 (four) hours as needed for moderate pain.     methocarbamol 500 MG tablet  Commonly known as:  ROBAXIN  Take 1 tablet (500 mg total) by mouth 3 (three) times daily as needed.     promethazine 50 MG tablet  Commonly known as:  PHENERGAN  Take 0.5 tablets (25 mg total) by mouth every 6 (six) hours as needed for nausea or vomiting.        Diagnostic Studies: Dg Shoulder Left Port  01/07/2014   CLINICAL DATA:  Status post surgery.  EXAM: PORTABLE LEFT  SHOULDER - 2+ VIEW  COMPARISON:  None.  FINDINGS: Single frontal radiograph of the left shoulder. Left humerus plate and screw fixation of surgical neck fracture. No dislocation. No destructive bony lesions. Shoulder soft tissue swelling with skin staples.  IMPRESSION: Status post ORIF of left surgical neck humerus fracture.   Electronically Signed   By: Awilda Metroourtnay  Bloomer   On: 01/07/2014 19:34    Disposition: Final discharge disposition not confirmed      Discharge Instructions   Call MD / Call 911    Complete by:  As directed   If you experience chest pain or shortness of breath, CALL 911 and be transported to the hospital emergency room.  If you develope a fever above 101 F, pus (white drainage) or increased drainage or redness at the wound, or calf pain, call your surgeon's office.     Constipation Prevention    Complete by:  As directed   Drink plenty of fluids.  Prune juice may be helpful.  You may use a stool softener, such as Colace (over the counter) 100 mg twice a day.  Use MiraLax (over the counter) for  constipation as needed.     Diet - low sodium heart healthy    Complete by:  As directed      Increase activity slowly as tolerated    Complete by:  As directed            Follow-up Information   Follow up with NORRIS,STEVEN R, MD. Call in 1 week. [phone])    Specialty:  Orthopedic Surgery   Contact information:   [address] [place] [id number] [phone]        Signed: Thea GistDIXON,Luther Newhouse B 01/09/2014, 11:36 AM

## 2014-01-10 ENCOUNTER — Encounter (HOSPITAL_COMMUNITY): Payer: Self-pay | Admitting: Orthopedic Surgery

## 2014-01-15 ENCOUNTER — Ambulatory Visit: Payer: PRIVATE HEALTH INSURANCE | Attending: Orthopedic Surgery | Admitting: Physical Therapy

## 2014-01-15 DIAGNOSIS — R609 Edema, unspecified: Secondary | ICD-10-CM | POA: Diagnosis not present

## 2014-01-15 DIAGNOSIS — M25519 Pain in unspecified shoulder: Secondary | ICD-10-CM | POA: Diagnosis not present

## 2014-01-15 DIAGNOSIS — M25619 Stiffness of unspecified shoulder, not elsewhere classified: Secondary | ICD-10-CM | POA: Diagnosis not present

## 2014-01-16 ENCOUNTER — Other Ambulatory Visit: Payer: Self-pay | Admitting: Physician Assistant

## 2014-01-16 ENCOUNTER — Ambulatory Visit (HOSPITAL_COMMUNITY): Payer: PRIVATE HEALTH INSURANCE | Admitting: Anesthesiology

## 2014-01-16 ENCOUNTER — Inpatient Hospital Stay (HOSPITAL_COMMUNITY)
Admission: AD | Admit: 2014-01-16 | Discharge: 2014-01-19 | DRG: 858 | Disposition: A | Discharge status: Home Health | Payer: PRIVATE HEALTH INSURANCE | Source: Ambulatory Visit | Attending: Orthopedic Surgery | Admitting: Orthopedic Surgery

## 2014-01-16 ENCOUNTER — Encounter (HOSPITAL_COMMUNITY): Payer: Self-pay | Admitting: *Deleted

## 2014-01-16 ENCOUNTER — Encounter (HOSPITAL_COMMUNITY)
Admission: AD | Disposition: A | Discharge status: Home Health | Payer: Self-pay | Source: Ambulatory Visit | Attending: Orthopedic Surgery

## 2014-01-16 ENCOUNTER — Encounter (HOSPITAL_COMMUNITY): Payer: PRIVATE HEALTH INSURANCE | Admitting: Anesthesiology

## 2014-01-16 DIAGNOSIS — Y831 Surgical operation with implant of artificial internal device as the cause of abnormal reaction of the patient, or of later complication, without mention of misadventure at the time of the procedure: Secondary | ICD-10-CM | POA: Diagnosis present

## 2014-01-16 DIAGNOSIS — F17200 Nicotine dependence, unspecified, uncomplicated: Secondary | ICD-10-CM | POA: Diagnosis present

## 2014-01-16 DIAGNOSIS — Z79899 Other long term (current) drug therapy: Secondary | ICD-10-CM | POA: Diagnosis not present

## 2014-01-16 DIAGNOSIS — T8140XA Infection following a procedure, unspecified, initial encounter: Principal | ICD-10-CM | POA: Diagnosis present

## 2014-01-16 DIAGNOSIS — T814XXA Infection following a procedure, initial encounter: Secondary | ICD-10-CM

## 2014-01-16 DIAGNOSIS — IMO0001 Reserved for inherently not codable concepts without codable children: Secondary | ICD-10-CM

## 2014-01-16 DIAGNOSIS — A4901 Methicillin susceptible Staphylococcus aureus infection, unspecified site: Secondary | ICD-10-CM | POA: Diagnosis present

## 2014-01-16 DIAGNOSIS — T8149XA Infection following a procedure, other surgical site, initial encounter: Secondary | ICD-10-CM | POA: Diagnosis present

## 2014-01-16 DIAGNOSIS — M25519 Pain in unspecified shoulder: Secondary | ICD-10-CM | POA: Diagnosis present

## 2014-01-16 DIAGNOSIS — K5900 Constipation, unspecified: Secondary | ICD-10-CM | POA: Diagnosis present

## 2014-01-16 HISTORY — PX: I&D EXTREMITY: SHX5045

## 2014-01-16 LAB — BASIC METABOLIC PANEL
ANION GAP: 14 (ref 5–15)
BUN: 10 mg/dL (ref 6–23)
CALCIUM: 9.1 mg/dL (ref 8.4–10.5)
CHLORIDE: 103 meq/L (ref 96–112)
CO2: 24 mEq/L (ref 19–32)
Creatinine, Ser: 0.75 mg/dL (ref 0.50–1.35)
GFR calc Af Amer: >90 mL/min (ref >90)
GFR calc non Af Amer: >90 mL/min (ref >90)
Glucose, Bld: 105 mg/dL — ABNORMAL HIGH (ref 70–99)
Potassium: 4.2 mEq/L (ref 3.7–5.3)
Sodium: 141 mEq/L (ref 137–147)

## 2014-01-16 LAB — DIFFERENTIAL
BASOS ABS: 0 10*3/uL (ref 0.0–0.1)
BASOS PCT: 0 % (ref 0–1)
Eosinophils Absolute: 0.2 10*3/uL (ref 0.0–0.7)
Eosinophils Relative: 2 % (ref 0–5)
Lymphocytes Relative: 12 % (ref 12–46)
Lymphs Abs: 1.5 10*3/uL (ref 0.7–4.0)
Monocytes Absolute: 1.1 10*3/uL — ABNORMAL HIGH (ref 0.1–1.0)
Monocytes Relative: 10 % (ref 3–12)
NEUTROS ABS: 9.2 10*3/uL — AB (ref 1.7–7.7)
Neutrophils Relative %: 76 % (ref 43–77)

## 2014-01-16 LAB — CBC
HEMATOCRIT: 31.7 % — AB (ref 39.0–52.0)
Hemoglobin: 10.8 g/dL — ABNORMAL LOW (ref 13.0–17.0)
MCH: 30.1 pg (ref 26.0–34.0)
MCHC: 34.1 g/dL (ref 30.0–36.0)
MCV: 88.3 fL (ref 78.0–100.0)
Platelets: 432 10*3/uL — ABNORMAL HIGH (ref 150–400)
RBC: 3.59 MIL/uL — ABNORMAL LOW (ref 4.22–5.81)
RDW: 11.8 % (ref 11.5–15.5)
WBC: 12 10*3/uL — ABNORMAL HIGH (ref 4.0–10.5)

## 2014-01-16 SURGERY — IRRIGATION AND DEBRIDEMENT EXTREMITY
Anesthesia: General | Site: Shoulder | Laterality: Left

## 2014-01-16 MED ORDER — OXYCODONE HCL 5 MG/5ML PO SOLN: 5.0000 mg | Freq: Once | ORAL | Status: AC | PRN

## 2014-01-16 MED ORDER — ONDANSETRON HCL 4 MG/2ML IJ SOLN
INTRAMUSCULAR | Status: DC | PRN
  Administered 2014-01-16: 4 mg via INTRAVENOUS

## 2014-01-16 MED ORDER — HYDROCODONE-ACETAMINOPHEN 7.5-325 MG PO TABS
1.0000 | ORAL_TABLET | ORAL | Status: DC | PRN
  Administered 2014-01-16: 1 via ORAL
  Administered 2014-01-17 (×3): 2 via ORAL
  Administered 2014-01-17: 1 via ORAL
  Administered 2014-01-17: 2 via ORAL
  Administered 2014-01-18: 1 via ORAL
  Administered 2014-01-18 – 2014-01-19 (×3): 2 via ORAL
  Filled 2014-01-16 (×4): qty 2
  Filled 2014-01-16: qty 1
  Filled 2014-01-16: qty 2
  Filled 2014-01-16 (×2): qty 1
  Filled 2014-01-16 (×2): qty 2

## 2014-01-16 MED ORDER — ACETAMINOPHEN 325 MG PO TABS
650.0000 mg | ORAL_TABLET | Freq: Four times a day (QID) | ORAL | Status: DC | PRN
  Administered 2014-01-18: 650 mg via ORAL
  Filled 2014-01-16: qty 2

## 2014-01-16 MED ORDER — ONDANSETRON HCL 4 MG/2ML IJ SOLN
INTRAMUSCULAR | Status: AC
  Filled 2014-01-16: qty 2

## 2014-01-16 MED ORDER — ONDANSETRON HCL 4 MG PO TABS: 4.0000 mg | ORAL_TABLET | Freq: Four times a day (QID) | ORAL | Status: DC | PRN

## 2014-01-16 MED ORDER — VANCOMYCIN HCL IN DEXTROSE 1-5 GM/200ML-% IV SOLN
1000.0000 mg | Freq: Two times a day (BID) | INTRAVENOUS | Status: AC
  Administered 2014-01-17: 1000 mg via INTRAVENOUS
  Filled 2014-01-16: qty 200

## 2014-01-16 MED ORDER — OXYCODONE HCL 5 MG PO TABS
ORAL_TABLET | ORAL | Status: AC
  Administered 2014-01-16: 5 mg via ORAL
  Filled 2014-01-16: qty 1

## 2014-01-16 MED ORDER — METHOCARBAMOL 500 MG PO TABS
500.0000 mg | ORAL_TABLET | Freq: Three times a day (TID) | ORAL | Status: DC | PRN
  Administered 2014-01-16: 500 mg via ORAL

## 2014-01-16 MED ORDER — SODIUM CHLORIDE 0.9 % IV SOLN
INTRAVENOUS | Status: DC
  Administered 2014-01-17 (×2): via INTRAVENOUS

## 2014-01-16 MED ORDER — METHOCARBAMOL 500 MG PO TABS
ORAL_TABLET | ORAL | Status: AC
  Administered 2014-01-16: 500 mg via ORAL
  Filled 2014-01-16: qty 1

## 2014-01-16 MED ORDER — PROPOFOL 10 MG/ML IV BOLUS
INTRAVENOUS | Status: AC
  Filled 2014-01-16: qty 20

## 2014-01-16 MED ORDER — FENTANYL CITRATE 0.05 MG/ML IJ SOLN
INTRAMUSCULAR | Status: AC
  Filled 2014-01-16: qty 2

## 2014-01-16 MED ORDER — METHOCARBAMOL 500 MG PO TABS
ORAL_TABLET | ORAL | Status: AC
  Filled 2014-01-16: qty 1

## 2014-01-16 MED ORDER — BISACODYL 10 MG RE SUPP
10.0000 mg | Freq: Every day | RECTAL | Status: DC | PRN
  Administered 2014-01-17: 10 mg via RECTAL
  Filled 2014-01-16: qty 1

## 2014-01-16 MED ORDER — ONDANSETRON HCL 4 MG/2ML IJ SOLN: 4.0000 mg | Freq: Once | INTRAMUSCULAR | Status: DC | PRN

## 2014-01-16 MED ORDER — METHOCARBAMOL 500 MG PO TABS
500.0000 mg | ORAL_TABLET | Freq: Four times a day (QID) | ORAL | Status: DC | PRN
  Administered 2014-01-16 – 2014-01-18 (×4): 500 mg via ORAL
  Filled 2014-01-16 (×4): qty 1

## 2014-01-16 MED ORDER — LIDOCAINE HCL (CARDIAC) 20 MG/ML IV SOLN
INTRAVENOUS | Status: DC | PRN
  Administered 2014-01-16: 60 mg via INTRAVENOUS

## 2014-01-16 MED ORDER — SUCCINYLCHOLINE CHLORIDE 20 MG/ML IJ SOLN
INTRAMUSCULAR | Status: DC | PRN
  Administered 2014-01-16: 100 mg via INTRAVENOUS

## 2014-01-16 MED ORDER — FENTANYL CITRATE 0.05 MG/ML IJ SOLN
INTRAMUSCULAR | Status: DC | PRN
  Administered 2014-01-16: 100 ug via INTRAVENOUS
  Administered 2014-01-16 (×2): 50 ug via INTRAVENOUS
  Administered 2014-01-16: 100 ug via INTRAVENOUS
  Administered 2014-01-16: 50 ug via INTRAVENOUS

## 2014-01-16 MED ORDER — DOCUSATE SODIUM 100 MG PO CAPS: 100.0000 mg | ORAL_CAPSULE | Freq: Two times a day (BID) | ORAL | Status: DC | PRN

## 2014-01-16 MED ORDER — ARTIFICIAL TEARS OP OINT: TOPICAL_OINTMENT | OPHTHALMIC | Status: DC | PRN

## 2014-01-16 MED ORDER — MIDAZOLAM HCL 2 MG/2ML IJ SOLN
INTRAMUSCULAR | Status: AC
  Filled 2014-01-16: qty 2

## 2014-01-16 MED ORDER — OXYCODONE HCL 5 MG PO TABS
ORAL_TABLET | ORAL | Status: AC
  Filled 2014-01-16: qty 1

## 2014-01-16 MED ORDER — MIDAZOLAM HCL 5 MG/5ML IJ SOLN
INTRAMUSCULAR | Status: DC | PRN
  Administered 2014-01-16: 2 mg via INTRAVENOUS

## 2014-01-16 MED ORDER — METOCLOPRAMIDE HCL 5 MG PO TABS: 5.0000 mg | ORAL_TABLET | Freq: Three times a day (TID) | ORAL | Status: DC | PRN

## 2014-01-16 MED ORDER — SUCCINYLCHOLINE CHLORIDE 20 MG/ML IJ SOLN
INTRAMUSCULAR | Status: AC
  Filled 2014-01-16: qty 1

## 2014-01-16 MED ORDER — ACETAMINOPHEN 650 MG RE SUPP: 650.0000 mg | Freq: Four times a day (QID) | RECTAL | Status: DC | PRN

## 2014-01-16 MED ORDER — FENTANYL CITRATE 0.05 MG/ML IJ SOLN
INTRAMUSCULAR | Status: AC
  Filled 2014-01-16: qty 5

## 2014-01-16 MED ORDER — VANCOMYCIN HCL IN DEXTROSE 1-5 GM/200ML-% IV SOLN
INTRAVENOUS | Status: AC
  Administered 2014-01-16: 1000 mg via INTRAVENOUS
  Filled 2014-01-16: qty 200

## 2014-01-16 MED ORDER — FENTANYL CITRATE 0.05 MG/ML IJ SOLN
25.0000 ug | INTRAMUSCULAR | Status: DC | PRN
  Administered 2014-01-16 (×2): 50 ug via INTRAVENOUS

## 2014-01-16 MED ORDER — ENOXAPARIN SODIUM 40 MG/0.4ML ~~LOC~~ SOLN
40.0000 mg | SUBCUTANEOUS | Status: DC
  Administered 2014-01-17 – 2014-01-19 (×3): 40 mg via SUBCUTANEOUS
  Filled 2014-01-16 (×3): qty 0.4

## 2014-01-16 MED ORDER — CHLORHEXIDINE GLUCONATE 4 % EX LIQD: 60.0000 mL | Freq: Once | CUTANEOUS | Status: AC

## 2014-01-16 MED ORDER — ARTIFICIAL TEARS OP OINT
TOPICAL_OINTMENT | OPHTHALMIC | Status: AC
  Filled 2014-01-16: qty 3.5

## 2014-01-16 MED ORDER — OXYCODONE HCL 5 MG PO TABS
5.0000 mg | ORAL_TABLET | Freq: Once | ORAL | Status: AC | PRN
  Administered 2014-01-16: 5 mg via ORAL

## 2014-01-16 MED ORDER — BUPIVACAINE-EPINEPHRINE (PF) 0.25% -1:200000 IJ SOLN
INTRAMUSCULAR | Status: AC
  Filled 2014-01-16: qty 30

## 2014-01-16 MED ORDER — PROMETHAZINE HCL 25 MG PO TABS: 25.0000 mg | ORAL_TABLET | Freq: Four times a day (QID) | ORAL | Status: DC | PRN

## 2014-01-16 MED ORDER — MENTHOL 3 MG MT LOZG: 1.0000 | LOZENGE | OROMUCOSAL | Status: DC | PRN

## 2014-01-16 MED ORDER — PHENOL 1.4 % MT LIQD: 1.0000 | OROMUCOSAL | Status: DC | PRN

## 2014-01-16 MED ORDER — ONDANSETRON HCL 4 MG/2ML IJ SOLN: 4.0000 mg | Freq: Four times a day (QID) | INTRAMUSCULAR | Status: DC | PRN

## 2014-01-16 MED ORDER — METHOCARBAMOL 1000 MG/10ML IJ SOLN
500.0000 mg | Freq: Four times a day (QID) | INTRAMUSCULAR | Status: DC | PRN
  Filled 2014-01-16: qty 5

## 2014-01-16 MED ORDER — LACTATED RINGERS IV SOLN
INTRAVENOUS | Status: DC
  Administered 2014-01-16 (×2): via INTRAVENOUS

## 2014-01-16 MED ORDER — OXYCODONE HCL 5 MG PO TABS
5.0000 mg | ORAL_TABLET | Freq: Once | ORAL | Status: AC | PRN
Start: 2014-01-16 — End: 2014-01-16
  Administered 2014-01-16: 5 mg via ORAL

## 2014-01-16 MED ORDER — LIDOCAINE HCL (CARDIAC) 20 MG/ML IV SOLN
INTRAVENOUS | Status: AC
  Filled 2014-01-16: qty 5

## 2014-01-16 MED ORDER — SODIUM CHLORIDE 0.9 % IR SOLN
Status: DC | PRN
  Administered 2014-01-16: 6000 mL

## 2014-01-16 MED ORDER — METOCLOPRAMIDE HCL 5 MG/ML IJ SOLN: 5.0000 mg | Freq: Three times a day (TID) | INTRAMUSCULAR | Status: DC | PRN

## 2014-01-16 MED ORDER — HYDROMORPHONE HCL PF 1 MG/ML IJ SOLN
0.5000 mg | INTRAMUSCULAR | Status: DC | PRN
  Administered 2014-01-16 – 2014-01-19 (×8): 1 mg via INTRAVENOUS
  Filled 2014-01-16 (×8): qty 1

## 2014-01-16 MED ORDER — BUPIVACAINE-EPINEPHRINE 0.25% -1:200000 IJ SOLN
INTRAMUSCULAR | Status: DC | PRN
  Administered 2014-01-16: 1 mL

## 2014-01-16 MED ORDER — PROPOFOL 10 MG/ML IV BOLUS
INTRAVENOUS | Status: DC | PRN
  Administered 2014-01-16: 180 mg via INTRAVENOUS

## 2014-01-16 SURGICAL SUPPLY — 56 items
BANDAGE ELASTIC 4 VELCRO ST LF (GAUZE/BANDAGES/DRESSINGS) ×3 IMPLANT
BNDG COHESIVE 4X5 TAN STRL (GAUZE/BANDAGES/DRESSINGS) ×3 IMPLANT
BNDG GAUZE ELAST 4 BULKY (GAUZE/BANDAGES/DRESSINGS) ×3 IMPLANT
BRUSH SCRUB DISP (MISCELLANEOUS) ×3 IMPLANT
COVER SURGICAL LIGHT HANDLE (MISCELLANEOUS) ×3 IMPLANT
CUFF TOURNIQUET SINGLE 18IN (TOURNIQUET CUFF) ×3 IMPLANT
CUFF TOURNIQUET SINGLE 24IN (TOURNIQUET CUFF) IMPLANT
CUFF TOURNIQUET SINGLE 34IN LL (TOURNIQUET CUFF) IMPLANT
DRAPE IMP U-DRAPE 54X76 (DRAPES) ×3 IMPLANT
DRAPE U-SHAPE 47X51 STRL (DRAPES) ×3 IMPLANT
DRSG ADAPTIC 3X8 NADH LF (GAUZE/BANDAGES/DRESSINGS) ×3 IMPLANT
DRSG PAD ABDOMINAL 8X10 ST (GAUZE/BANDAGES/DRESSINGS) ×3 IMPLANT
DURAPREP 26ML APPLICATOR (WOUND CARE) ×3 IMPLANT
ELECT REM PT RETURN 9FT ADLT (ELECTROSURGICAL)
ELECTRODE REM PT RTRN 9FT ADLT (ELECTROSURGICAL) IMPLANT
FACESHIELD WRAPAROUND (MASK) ×3 IMPLANT
GAUZE SPONGE 4X4 12PLY STRL (GAUZE/BANDAGES/DRESSINGS) ×3 IMPLANT
GLOVE BIOGEL PI IND STRL 7.0 (GLOVE) ×1 IMPLANT
GLOVE BIOGEL PI INDICATOR 7.0 (GLOVE) ×2
GLOVE BIOGEL PI ORTHO PRO 7.5 (GLOVE) ×2
GLOVE BIOGEL PI ORTHO PRO SZ8 (GLOVE) ×2
GLOVE ORTHO TXT STRL SZ7.5 (GLOVE) ×3 IMPLANT
GLOVE PI ORTHO PRO STRL 7.5 (GLOVE) ×1 IMPLANT
GLOVE PI ORTHO PRO STRL SZ8 (GLOVE) ×1 IMPLANT
GLOVE SURG ORTHO 8.5 STRL (GLOVE) ×3 IMPLANT
GLOVE SURG SS PI 7.0 STRL IVOR (GLOVE) ×3 IMPLANT
GOWN STRL REUS W/ TWL LRG LVL3 (GOWN DISPOSABLE) ×1 IMPLANT
GOWN STRL REUS W/ TWL XL LVL3 (GOWN DISPOSABLE) ×2 IMPLANT
GOWN STRL REUS W/TWL LRG LVL3 (GOWN DISPOSABLE) ×2
GOWN STRL REUS W/TWL XL LVL3 (GOWN DISPOSABLE) ×4
HANDPIECE INTERPULSE COAX TIP (DISPOSABLE)
KIT BASIN OR (CUSTOM PROCEDURE TRAY) ×3 IMPLANT
KIT ROOM TURNOVER OR (KITS) ×3 IMPLANT
MANIFOLD NEPTUNE II (INSTRUMENTS) ×3 IMPLANT
NS IRRIG 1000ML POUR BTL (IV SOLUTION) ×3 IMPLANT
PACK ORTHO EXTREMITY (CUSTOM PROCEDURE TRAY) ×3 IMPLANT
PAD ARMBOARD 7.5X6 YLW CONV (MISCELLANEOUS) ×6 IMPLANT
PENCIL BUTTON HOLSTER BLD 10FT (ELECTRODE) IMPLANT
RESECTOR FULL RADIUS 4.2MM (BLADE) ×3 IMPLANT
SET HNDPC FAN SPRY TIP SCT (DISPOSABLE) IMPLANT
SPONGE GAUZE 4X4 12PLY STER LF (GAUZE/BANDAGES/DRESSINGS) ×3 IMPLANT
SPONGE LAP 18X18 X RAY DECT (DISPOSABLE) ×3 IMPLANT
SPONGE LAP 4X18 X RAY DECT (DISPOSABLE) ×3 IMPLANT
STOCKINETTE IMPERVIOUS 9X36 MD (GAUZE/BANDAGES/DRESSINGS) ×3 IMPLANT
SUT ETHILON 2 0 FS 18 (SUTURE) IMPLANT
SUT VIC AB 2-0 CT1 27 (SUTURE)
SUT VIC AB 2-0 CT1 TAPERPNT 27 (SUTURE) IMPLANT
TAPE CLOTH SURG 6X10 WHT LF (GAUZE/BANDAGES/DRESSINGS) ×3 IMPLANT
TOWEL OR 17X24 6PK STRL BLUE (TOWEL DISPOSABLE) ×3 IMPLANT
TOWEL OR 17X26 10 PK STRL BLUE (TOWEL DISPOSABLE) ×3 IMPLANT
TUBE ANAEROBIC SPECIMEN COL (MISCELLANEOUS) IMPLANT
TUBE CONNECTING 12'X1/4 (SUCTIONS) ×1
TUBE CONNECTING 12X1/4 (SUCTIONS) ×2 IMPLANT
UNDERPAD 30X30 INCONTINENT (UNDERPADS AND DIAPERS) ×3 IMPLANT
WATER STERILE IRR 1000ML POUR (IV SOLUTION) ×3 IMPLANT
YANKAUER SUCT BULB TIP NO VENT (SUCTIONS) ×3 IMPLANT

## 2014-01-16 NOTE — Anesthesia Preprocedure Evaluation (Addendum)
Anesthesia Evaluation  Patient identified by MRN, date of birth, ID band Patient awake    Airway Mallampati: II TM Distance: >3 FB Neck ROM: Full    Dental  (+) Teeth Intact, Dental Advisory Given   Pulmonary Current Smoker,  breath sounds clear to auscultation        Cardiovascular Rhythm:Regular Rate:Normal     Neuro/Psych    GI/Hepatic   Endo/Other    Renal/GU      Musculoskeletal   Abdominal   Peds  Hematology   Anesthesia Other Findings   Reproductive/Obstetrics                          Anesthesia Physical Anesthesia Plan  ASA: II  Anesthesia Plan: General   Post-op Pain Management:    Induction: Intravenous  Airway Management Planned: Oral ETT  Additional Equipment:   Intra-op Plan:   Post-operative Plan: Extubation in OR  Informed Consent: I have reviewed the patients History and Physical, chart, labs and discussed the procedure including the risks, benefits and alternatives for the proposed anesthesia with the patient or authorized representative who has indicated his/her understanding and acceptance.   Dental advisory given  Plan Discussed with: CRNA and Anesthesiologist  Anesthesia Plan Comments:         Anesthesia Quick Evaluation

## 2014-01-16 NOTE — Anesthesia Procedure Notes (Signed)
Procedure Name: Intubation Date/Time: 01/16/2014 5:01 PM Performed by: Jefm MilesENNIE, Doni Widmer E Pre-anesthesia Checklist: Patient identified, Emergency Drugs available, Suction available, Patient being monitored and Timeout performed Patient Re-evaluated:Patient Re-evaluated prior to inductionOxygen Delivery Method: Circle system utilized Preoxygenation: Pre-oxygenation with 100% oxygen Intubation Type: IV induction and Rapid sequence Laryngoscope Size: Mac and 3 Grade View: Grade I Tube type: Oral Tube size: 7.5 mm Number of attempts: 1 Airway Equipment and Method: Stylet Placement Confirmation: ETT inserted through vocal cords under direct vision,  positive ETCO2 and breath sounds checked- equal and bilateral Secured at: 23 cm Tube secured with: Tape Dental Injury: Teeth and Oropharynx as per pre-operative assessment

## 2014-01-16 NOTE — Anesthesia Postprocedure Evaluation (Signed)
Anesthesia Post Note  Patient: [name]  Procedure(s) Performed: Procedure(s) (LRB): IRRIGATION AND DEBRIDEMENT EXTREMITY/LEFT SHOULDER (Left)  Anesthesia type: General  Patient location: PACU  Post pain: Pain level controlled and Adequate analgesia  Post assessment: Post-op Vital signs reviewed, Patient's Cardiovascular Status Stable, Respiratory Function Stable, Patent Airway and Pain level controlled  Last Vitals:  Filed Vitals:   01/16/14 2001  BP: 138/76  Pulse: 99  Temp: 36.7 C  Resp: 18    Post vital signs: Reviewed and stable  Level of consciousness: awake, alert  and oriented  Complications: No apparent anesthesia complications

## 2014-01-16 NOTE — Brief Op Note (Signed)
01/16/2014  6:25 PM  PATIENT:  [name]  23 y.o. male  PRE-OPERATIVE DIAGNOSIS:  Infected Left shoulder  POST-OPERATIVE DIAGNOSIS:  Infected Left shoulder  PROCEDURE:  Procedure(s): IRRIGATION AND DEBRIDEMENT EXTREMITY/LEFT SHOULDER (Left), arthroscopic and open, deep cultures  SURGEON:  Surgeon(s) and Role:    * Verlee RossettiSteven R Meghanne Pletz, MD - Primary  PHYSICIAN ASSISTANT:   ASSISTANTS: none   ANESTHESIA:   general  EBL:  Total I/O In: 1000 [I.V.:1000] Out: 100 [Blood:100]  BLOOD ADMINISTERED:none  DRAINS: Hemovac drain deep to deltoid   LOCAL MEDICATIONS USED:  NONE  SPECIMEN:  Source of Specimen:  culture #1 shoulder joint fluid,  culture #2 shoulder wound  DISPOSITION OF SPECIMEN:  micro  COUNTS:  YES  TOURNIQUET:  * No tourniquets in log *  DICTATION: .Other Dictation: Dictation Number [id number]  PLAN OF CARE: Admit to inpatient   PATIENT DISPOSITION:  PACU - hemodynamically stable.   Delay start of Pharmacological VTE agent (>24hrs) due to surgical blood loss or risk of bleeding: not applicable

## 2014-01-16 NOTE — Interval H&P Note (Signed)
History and Physical Interval Note:  01/16/2014 4:15 PM  [name] Jovel  has presented today for surgery, with the diagnosis of Infected Left shoulder  The various methods of treatment have been discussed with the patient and family. After consideration of risks, benefits and other options for treatment, the patient has consented to  Procedure(s): IRRIGATION AND DEBRIDEMENT EXTREMITY/LEFT SHOULDER (Left) as a surgical intervention .  The patient's history has been reviewed, patient examined, no change in status, stable for surgery.  I have reviewed the patient's chart and labs.  Questions were answered to the patient's satisfaction.     Louie Meaders,STEVEN R

## 2014-01-16 NOTE — Transfer of Care (Signed)
Immediate Anesthesia Transfer of Care Note  Patient: [name]  Procedure(s) Performed: Procedure(s): IRRIGATION AND DEBRIDEMENT EXTREMITY/LEFT SHOULDER (Left)  Patient Location: PACU  Anesthesia Type:General  Level of Consciousness: awake and oriented  Airway & Oxygen Therapy: Patient Spontanous Breathing and Patient connected to nasal cannula oxygen  Post-op Assessment: Report given to PACU RN  Post vital signs: Reviewed and stable  Complications: No apparent anesthesia complications
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
## 2014-01-17 ENCOUNTER — Encounter (HOSPITAL_COMMUNITY): Payer: Self-pay | Admitting: Orthopedic Surgery

## 2014-01-17 MED ORDER — POLYETHYLENE GLYCOL 3350 17 G PO PACK
17.0000 g | PACK | Freq: Every day | ORAL | Status: DC
  Administered 2014-01-17 – 2014-01-19 (×3): 17 g via ORAL
  Filled 2014-01-17 (×3): qty 1

## 2014-01-17 MED ORDER — DOCUSATE SODIUM 100 MG PO CAPS
100.0000 mg | ORAL_CAPSULE | Freq: Two times a day (BID) | ORAL | Status: DC
  Administered 2014-01-17 – 2014-01-19 (×5): 100 mg via ORAL
  Filled 2014-01-17 (×4): qty 1

## 2014-01-17 MED ORDER — SODIUM CHLORIDE 0.9 % IJ SOLN
10.0000 mL | INTRAMUSCULAR | Status: DC | PRN
  Administered 2014-01-19 (×2): 10 mL

## 2014-01-17 MED ORDER — VANCOMYCIN HCL 10 G IV SOLR
1500.0000 mg | Freq: Two times a day (BID) | INTRAVENOUS | Status: DC
  Administered 2014-01-17 – 2014-01-18 (×3): 1500 mg via INTRAVENOUS
  Filled 2014-01-17 (×3): qty 1500

## 2014-01-17 NOTE — Progress Notes (Signed)
Orthopedics Progress Note  Subjective: I feel better this morning.  Objective:  Filed Vitals:   01/17/14 0523  BP: 126/66  Pulse: 85  Temp: 98.1 F (36.7 C)  Resp: 16    General: Awake and alert  Musculoskeletal: left shoulder dressing intact, NVI, drain in place and functioning Neurovascularly intact  Lab Results  Component Value Date   WBC 12.0* 01/16/2014   HGB 10.8* 01/16/2014   HCT 31.7* 01/16/2014   MCV 88.3 01/16/2014   PLT 432* 01/16/2014       Component Value Date/Time   NA 141 01/16/2014 1822   K 4.2 01/16/2014 1822   CL 103 01/16/2014 1822   CO2 24 01/16/2014 1822   GLUCOSE 105* 01/16/2014 1822   BUN 10 01/16/2014 1822   CREATININE 0.75 01/16/2014 1822   CALCIUM 9.1 01/16/2014 1822   GFRNONAA >90 01/16/2014 1822   GFRAA >90 01/16/2014 1822    No results found for this basename: INR, PROTIME    Assessment/Plan: POD #1 s/p Procedure(s): IRRIGATION AND DEBRIDEMENT EXTREMITY/LEFT SHOULDER Likely staph aureus in the shoulder.  Plan IV Vanc until culture information available.  Continue drain until tomorrow.  PICC line pending   Almedia BallsSteven R. Ranell PatrickNorris, MD 01/17/2014 8:25 AM
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
## 2014-01-17 NOTE — Progress Notes (Signed)
Orthopedics Progress Note  Subjective: Patient still reporting problems with constipation despite all of the stool softeners and miralax and suppositories.  Pain is well controlled  Objective:  Filed Vitals:   01/17/14 1420  BP: 125/80  Pulse: 83  Temp: 98 F (36.7 C)  Resp: 15    General: Awake and alert  Musculoskeletal: left shoulder dressing changed.  I was able to express serous drainage from the wound that was not cloudy and did not smell.  Drain is in place and suction is intact. Decreased redness. Neurovascularly intact  Lab Results  Component Value Date   WBC 12.0* 01/16/2014   HGB 10.8* 01/16/2014   HCT 31.7* 01/16/2014   MCV 88.3 01/16/2014   PLT 432* 01/16/2014       Component Value Date/Time   NA 141 01/16/2014 1822   K 4.2 01/16/2014 1822   CL 103 01/16/2014 1822   CO2 24 01/16/2014 1822   GLUCOSE 105* 01/16/2014 1822   BUN 10 01/16/2014 1822   CREATININE 0.75 01/16/2014 1822   CALCIUM 9.1 01/16/2014 1822   GFRNONAA >90 01/16/2014 1822   GFRAA >90 01/16/2014 1822    No results found for this basename: INR, PROTIME    Assessment/Plan: POD #1 s/p Procedure(s): IRRIGATION AND DEBRIDEMENT EXTREMITY/LEFT SHOULDER Will remove the drain tomorrow.  Dressing changes as needed to keep the area dry.  Continue Vancomycin pending culture results. D/C pending - possibly tomorrow  Almedia BallsSteven R. Ranell PatrickNorris, MD 01/17/2014 6:28 PM

## 2014-01-17 NOTE — Progress Notes (Signed)
Peripherally Inserted Central Catheter/Midline Placement  The IV Nurse has discussed with the patient and/or persons authorized to consent for the patient, the purpose of this procedure and the potential benefits and risks involved with this procedure.  The benefits include less needle sticks, lab draws from the catheter and patient may be discharged home with the catheter.  Risks include, but not limited to, infection, bleeding, blood clot (thrombus formation), and puncture of an artery; nerve damage and irregular heat beat.  Alternatives to this procedure were also discussed.  PICC/Midline Placement Documentation  PICC / Midline Single Lumen 01/17/14 PICC Right Basilic 42 cm 1 cm (Active)  Indication for Insertion or Continuance of Line Home intravenous therapies (PICC only) 01/17/2014 11:44 AM  Exposed Catheter (cm) 1 cm 01/17/2014 11:44 AM  Site Assessment Clean;Dry;Intact 01/17/2014 11:44 AM  Line Status Flushed;Saline locked;Blood return noted 01/17/2014 11:44 AM  Dressing Type Transparent 01/17/2014 11:44 AM  Dressing Status Clean;Antimicrobial disc in place;Dry;Intact 01/17/2014 11:44 AM  Line Care Connections checked and tightened 01/17/2014 11:44 AM  Dressing Intervention New dressing 01/17/2014 11:44 AM  Dressing Change Due 01/24/14 01/17/2014 11:44 AM       Elliot Dallyiggs, [place] Mcphee Wright 01/17/2014, 11:46 AM
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
## 2014-01-17 NOTE — Progress Notes (Signed)
Orthopedic Tech Progress Note Patient Details:  Iona BeardLee Cooperman 06/01/1991 [id number]  Ortho Devices Type of Ortho Device: Arm sling Ortho Device/Splint Interventions: Casandra DoffingOrdered   Clessie Karras Craig 01/17/2014, 7:21 PM

## 2014-01-18 MED ORDER — SODIUM CHLORIDE 0.9 % IV SOLN
1500.0000 mg | Freq: Two times a day (BID) | INTRAVENOUS | Status: DC
  Administered 2014-01-18 – 2014-01-19 (×2): 1500 mg via INTRAVENOUS
  Filled 2014-01-18 (×3): qty 1500

## 2014-01-18 NOTE — Discharge Instructions (Signed)
Reinforce dressing as needed.  Do not get the incision wet until one week after last drainage.  Wear the sling while up walking around, but can remove while seated.  Do exercises every hour.  IV antibiotics for 4 weeks.  Routine PICC care   Follow up with Dr Ranell PatrickNorris in one week.

## 2014-01-18 NOTE — Care Management Note (Signed)
  Page 2 of 2   01/18/2014     2:28:38 PM CARE MANAGEMENT NOTE 01/18/2014  Patient:  [name],[name]   Account Number:  [id number]  Date Initiated:  01/18/2014  Documentation initiated by:  Ronny FlurryWILE,Sharel Behne  Subjective/Objective Assessment:     Action/Plan:   Anticipated DC Date:  01/18/2014   Anticipated DC Plan:  HOME W HOME HEALTH SERVICES         Choice offered to / List presented to:  C-1 Patient        HH arranged  HH[facility] RN      [facility]   Status of service:   Medicare Important Message given?   (If response is "NO", the following Medicare IM given date fields will be blank) Date Medicare IM given:   Medicare IM given by:   Date Additional Medicare IM given:   Additional Medicare IM given by:    Discharge Disposition:    Per UR Regulation:    If discussed at Long Length of Stay Meetings, dates discussed:    Comments:  01-18-14 Final cultures / sensitives will be  back  01-19-14 , then determination will be made regarding Vancomycin / Ancef .   Referral given to [facility] . MD will write prescription for IV antibiotics tomorrow morning . [facility] will need prescription.   Ronny FlurryHeather Tersa Fotopoulos RN BSN [phone]    01-18-14 Spoke with patient and his mother [name] at bedside , confirmed face sheet information. Theyu would like [facility] . Aware they with be shown how to adminster IV antibiotics and wound care .  Contact number is Mar DaringLisa [phone]  Will need home health orders with instructions on dressing change ( type and frequency ) , and prescription for IV antibiotics .  Thanks  Ronny FlurryHeather Vera Furniss RN BSN [phone]

## 2014-01-18 NOTE — Progress Notes (Signed)
Orthopedics Progress Note  Subjective: Patient reports improvement in pain today and he has been trying to wean off the meds  Objective:  Filed Vitals:   01/18/14 1259  BP: 151/91  Pulse: 98  Temp: 97.9 F (36.6 C)  Resp: 16    General: Awake and alert  Musculoskeletal: left shoulder incision with less drainage today and no erythema, drain out Neurovascularly intact  Lab Results  Component Value Date   WBC 12.0* 01/16/2014   HGB 10.8* 01/16/2014   HCT 31.7* 01/16/2014   MCV 88.3 01/16/2014   PLT 432* 01/16/2014       Component Value Date/Time   NA 141 01/16/2014 1822   K 4.2 01/16/2014 1822   CL 103 01/16/2014 1822   CO2 24 01/16/2014 1822   GLUCOSE 105* 01/16/2014 1822   BUN 10 01/16/2014 1822   CREATININE 0.75 01/16/2014 1822   CALCIUM 9.1 01/16/2014 1822   GFRNONAA >90 01/16/2014 1822   GFRAA >90 01/16/2014 1822    No results found for this basename: INR, PROTIME    Assessment/Plan: POD #2 s/p Procedure(s): IRRIGATION AND DEBRIDEMENT EXTREMITY/LEFT SHOULDER Drain out and drainage improved.  Cultures show Staph Aureus but sensitivities not back yet Plan D/C tomorrow on 4 weeks IV antibiotics - Vancomycin is the plan for now until the culture information is back which is supposed to be tomorrow  Follow up next week with me in the office  Viviann SpareSteven R. Ranell PatrickNorris, MD 01/18/2014 2:05 PM

## 2014-01-18 NOTE — Discharge Summary (Signed)
Physician Discharge Summary   Patient ID: [name] MRN: [id number] DOB/AGE: [birth year] 23 y.o.  Admit date: 01/16/2014 Discharge date: 01/18/2014  Admission Diagnoses:  Active Problems:   Surgical wound infection   Discharge Diagnoses:  Same   Surgeries: Procedure(s): IRRIGATION AND DEBRIDEMENT EXTREMITY/LEFT SHOULDER on 01/16/2014   Consultants: D/C planning  Discharged Condition: Stable  Hospital Course: [name] is an 23 y.o. male who was admitted 01/16/2014 with a chief complaint of left shoulder infection, and found to have a diagnosis of left shoulder infection.  They were brought to the operating room on 01/16/2014 and underwent the above named procedures.    The patient had an uncomplicated hospital course and was stable for discharge.  Recent vital signs:  Filed Vitals:   01/18/14 1259  BP: 151/91  Pulse: 98  Temp: 97.9 F (36.6 C)  Resp: 16    Recent laboratory studies:  Results for orders placed during the hospital encounter of 01/16/14  ANAEROBIC CULTURE      Result Value Ref Range   Specimen Description SYNOVIAL FLUID SHOULDER LEFT     Special Requests FLUID ON SWAB NO 1     Gram Stain PENDING     Culture       Value: No Anaerobes Isolated; Culture in Progress for 14 DAYS     Performed at [facility]   Report Status PENDING    BODY FLUID CULTURE      Result Value Ref Range   Specimen Description SYNOVIAL FLUID SHOULDER LEFT     Special Requests FLUID ON SWAB NO 1     Gram Stain       Value: NO WBC SEEN     NO ORGANISMS SEEN     Performed at [facility]   Culture       Value: NO GROWTH 2 DAYS     Performed at [facility]   Report Status PENDING    WOUND CULTURE      Result Value Ref Range   Specimen Description WOUND SHOULDER LEFT     Special Requests SPECIMEN NO 2     Gram Stain       Value: RARE WBC PRESENT,BOTH PMN AND MONONUCLEAR     NO SQUAMOUS EPITHELIAL CELLS SEEN     FEW GRAM POSITIVE COCCI  IN PAIRS     IN CLUSTERS     Performed at [facility]   Culture       Value: MODERATE STAPHYLOCOCCUS AUREUS     Note: RIFAMPIN AND GENTAMICIN SHOULD NOT BE USED AS SINGLE DRUGS FOR TREATMENT OF STAPH INFECTIONS.     Performed at [facility]   Report Status PENDING    ANAEROBIC CULTURE      Result Value Ref Range   Specimen Description WOUND SHOULDER LEFT     Special Requests SPECIMEN NO 2     Gram Stain       Value: RARE WBC PRESENT,BOTH PMN AND MONONUCLEAR     NO SQUAMOUS EPITHELIAL CELLS SEEN     FEW GRAM POSITIVE COCCI IN PAIRS     IN CLUSTERS     Performed at [facility]   Culture       Value: No Anaerobes Isolated; Culture in Progress for     Performed at [facility]   Report Status PENDING    CBC      Result Value Ref Range   WBC 12.0 (*) 4.0 - 10.5 K/uL  RBC 3.59 (*) 4.22 - 5.81 MIL/uL   Hemoglobin 10.8 (*) 13.0 - 17.0 g/dL   HCT 16.131.7 (*) 09.639.0 - 04.552.0 %   MCV 88.3  78.0 - 100.0 fL   MCH 30.1  26.0 - 34.0 pg   MCHC 34.1  30.0 - 36.0 g/dL   RDW 40.911.8  81.111.5 - 91.415.5 %   Platelets 432 (*) 150 - 400 K/uL  DIFFERENTIAL      Result Value Ref Range   Neutrophils Relative % 76  43 - 77 %   Neutro Abs 9.2 (*) 1.7 - 7.7 K/uL   Lymphocytes Relative 12  12 - 46 %   Lymphs Abs 1.5  0.7 - 4.0 K/uL   Monocytes Relative 10  3 - 12 %   Monocytes Absolute 1.1 (*) 0.1 - 1.0 K/uL   Eosinophils Relative 2  0 - 5 %   Eosinophils Absolute 0.2  0.0 - 0.7 K/uL   Basophils Relative 0  0 - 1 %   Basophils Absolute 0.0  0.0 - 0.1 K/uL  BASIC METABOLIC PANEL      Result Value Ref Range   Sodium 141  137 - 147 mEq/L   Potassium 4.2  3.7 - 5.3 mEq/L   Chloride 103  96 - 112 mEq/L   CO2 24  19 - 32 mEq/L   Glucose, Bld 105 (*) 70 - 99 mg/dL   BUN 10  6 - 23 mg/dL   Creatinine, Ser 7.820.75  0.50 - 1.35 mg/dL   Calcium 9.1  8.4 - 95.610.5 mg/dL   GFR calc non Af Amer >90  >90 mL/min   GFR calc Af Amer >90  >90 mL/min   Anion gap 14  5 - 15    Discharge  Medications:     Medication List    ASK your doctor about these medications       docusate sodium 100 MG capsule  Commonly known as:  COLACE  Take 100 mg by mouth 2 (two) times daily as needed for mild constipation.     HYDROcodone-acetaminophen 7.5-325 MG per tablet  Commonly known as:  NORCO  Take 1-2 tablets by mouth every 4 (four) hours as needed for moderate pain.     methocarbamol 500 MG tablet  Commonly known as:  ROBAXIN  Take 500 mg by mouth every 8 (eight) hours as needed for muscle spasms.     oxyCODONE 5 MG immediate release tablet  Commonly known as:  Oxy IR/ROXICODONE  Take 5 mg by mouth once as needed for severe pain.     promethazine 50 MG tablet  Commonly known as:  PHENERGAN  Take 25 mg by mouth every 6 (six) hours as needed for nausea or vomiting.        Diagnostic Studies: Dg Shoulder Left Port  01/07/2014   CLINICAL DATA:  Status post surgery.  EXAM: PORTABLE LEFT SHOULDER - 2+ VIEW  COMPARISON:  None.  FINDINGS: Single frontal radiograph of the left shoulder. Left humerus plate and screw fixation of surgical neck fracture. No dislocation. No destructive bony lesions. Shoulder soft tissue swelling with skin staples.  IMPRESSION: Status post ORIF of left surgical neck humerus fracture.   Electronically Signed   By: Awilda Metroourtnay  Bloomer   On: 01/07/2014 19:34    Disposition: 01-Home or Self Care Patient will need 4 weeks of IV antibiotics followed by 6 weeks of oral antibiotics.  Final antibiotic selection pending at the time of this note.  Keep dry  dressing in place until follow up with Dr Ranell Patrick.  Follow up in one week.  [phone]       Follow-up Information   Follow up with Conception Doebler,STEVEN R, MD In 1 week. [phone])    Specialty:  Orthopedic Surgery   Contact information:   [address] [place] [address] [phone]        Signed: Verlee Rossetti 01/18/2014, 2:10 PM

## 2014-01-19 LAB — WOUND CULTURE

## 2014-01-19 LAB — CREATININE, SERUM
Creatinine, Ser: 0.71 mg/dL (ref 0.50–1.35)
GFR calc Af Amer: >90 mL/min (ref >90)
GFR calc non Af Amer: >90 mL/min (ref >90)

## 2014-01-19 MED ORDER — METHOCARBAMOL 500 MG PO TABS: 500.0000 mg | ORAL_TABLET | Freq: Three times a day (TID) | ORAL | Status: DC | PRN

## 2014-01-19 MED ORDER — HEPARIN SOD (PORK) LOCK FLUSH 100 UNIT/ML IV SOLN
250.0000 [IU] | INTRAVENOUS | Status: AC | PRN
  Administered 2014-01-19: 250 [IU]

## 2014-01-19 MED ORDER — VANCOMYCIN HCL 10 G IV SOLR: 1500.0000 mg | Freq: Two times a day (BID) | INTRAVENOUS | Status: AC

## 2014-01-19 MED ORDER — METHOCARBAMOL 500 MG PO TABS: 500.0000 mg | ORAL_TABLET | Freq: Three times a day (TID) | ORAL | Status: AC | PRN

## 2014-01-19 MED ORDER — HYDROCODONE-ACETAMINOPHEN 7.5-325 MG PO TABS: 1.0000 | ORAL_TABLET | ORAL | Status: AC | PRN

## 2014-01-19 MED ORDER — HYDROCODONE-ACETAMINOPHEN 7.5-325 MG PO TABS: 1.0000 | ORAL_TABLET | ORAL | Status: DC | PRN

## 2014-01-19 NOTE — Progress Notes (Signed)
Spoke with [facility] regarding home PICC and IV antibiotics Vanc.  Rx on front of chart for them to obtain IV antibiotics.  Pt will go home with mom and dad and mom will care for PICC and admin of antibx.  0800 Vanc given.

## 2014-01-19 NOTE — Progress Notes (Signed)
Orthopedics Progress Note  Subjective: I feel better today  Objective:  Filed Vitals:   01/19/14 0539  BP: 109/45  Pulse: 85  Temp: 97.8 F (36.6 C)  Resp: 15    General: Awake and alert  Musculoskeletal: incision looks good, however there is still some slightly turbid fluid(no smell) from the inferior part of the wound, no erythema Neurovascularly intact  Lab Results  Component Value Date   WBC 12.0* 01/16/2014   HGB 10.8* 01/16/2014   HCT 31.7* 01/16/2014   MCV 88.3 01/16/2014   PLT 432* 01/16/2014       Component Value Date/Time   NA 141 01/16/2014 1822   K 4.2 01/16/2014 1822   CL 103 01/16/2014 1822   CO2 24 01/16/2014 1822   GLUCOSE 105* 01/16/2014 1822   BUN 10 01/16/2014 1822   CREATININE 0.71 01/19/2014 0430   CALCIUM 9.1 01/16/2014 1822   GFRNONAA >90 01/19/2014 0430   GFRAA >90 01/19/2014 0430    No results found for this basename: INR, PROTIME    Assessment/Plan: POD #3 s/p Procedure(s): IRRIGATION AND DEBRIDEMENT EXTREMITY/LEFT SHOULDER Patient remains on Vancomycin pending the sensitivities that are due back today. D/C antibiotic based upon that Home health set up for PICC care and home IV antibiotics for 4 weeks  Almedia BallsSteven R. Ranell PatrickNorris, MD 01/19/2014 6:55 AM

## 2014-01-22 ENCOUNTER — Ambulatory Visit: Payer: PRIVATE HEALTH INSURANCE | Admitting: Physical Therapy

## 2014-01-22 DIAGNOSIS — M25519 Pain in unspecified shoulder: Secondary | ICD-10-CM | POA: Diagnosis not present

## 2014-01-22 DIAGNOSIS — R609 Edema, unspecified: Secondary | ICD-10-CM | POA: Diagnosis not present

## 2014-01-22 DIAGNOSIS — M25619 Stiffness of unspecified shoulder, not elsewhere classified: Secondary | ICD-10-CM | POA: Diagnosis not present

## 2014-01-24 ENCOUNTER — Ambulatory Visit: Payer: PRIVATE HEALTH INSURANCE | Admitting: Physical Therapy

## 2014-01-24 DIAGNOSIS — M25519 Pain in unspecified shoulder: Secondary | ICD-10-CM | POA: Diagnosis not present

## 2014-01-26 LAB — BODY FLUID CULTURE: Gram Stain: NONE SEEN

## 2014-01-30 ENCOUNTER — Ambulatory Visit: Payer: PRIVATE HEALTH INSURANCE | Admitting: Physical Therapy

## 2014-01-30 DIAGNOSIS — M25519 Pain in unspecified shoulder: Secondary | ICD-10-CM | POA: Diagnosis not present

## 2014-01-30 LAB — ANAEROBIC CULTURE: Gram Stain: NONE SEEN

## 2014-02-01 ENCOUNTER — Ambulatory Visit: Payer: PRIVATE HEALTH INSURANCE | Admitting: Physical Therapy

## 2014-02-01 DIAGNOSIS — M25519 Pain in unspecified shoulder: Secondary | ICD-10-CM | POA: Diagnosis not present

## 2014-02-06 ENCOUNTER — Ambulatory Visit: Payer: PRIVATE HEALTH INSURANCE | Attending: Orthopedic Surgery | Admitting: Physical Therapy

## 2014-02-06 DIAGNOSIS — M25619 Stiffness of unspecified shoulder, not elsewhere classified: Secondary | ICD-10-CM | POA: Diagnosis not present

## 2014-02-06 DIAGNOSIS — M25519 Pain in unspecified shoulder: Secondary | ICD-10-CM | POA: Diagnosis not present

## 2014-02-06 DIAGNOSIS — R609 Edema, unspecified: Secondary | ICD-10-CM | POA: Insufficient documentation

## 2014-02-08 ENCOUNTER — Ambulatory Visit: Payer: PRIVATE HEALTH INSURANCE | Admitting: Physical Therapy

## 2014-02-08 DIAGNOSIS — M25519 Pain in unspecified shoulder: Secondary | ICD-10-CM | POA: Diagnosis not present

## 2014-02-12 ENCOUNTER — Ambulatory Visit: Payer: PRIVATE HEALTH INSURANCE | Admitting: Physical Therapy

## 2014-02-12 DIAGNOSIS — M25519 Pain in unspecified shoulder: Secondary | ICD-10-CM | POA: Diagnosis not present

## 2014-02-14 ENCOUNTER — Ambulatory Visit: Payer: PRIVATE HEALTH INSURANCE | Admitting: Physical Therapy

## 2014-02-14 DIAGNOSIS — M25519 Pain in unspecified shoulder: Secondary | ICD-10-CM | POA: Diagnosis not present

## 2014-02-19 ENCOUNTER — Ambulatory Visit: Payer: PRIVATE HEALTH INSURANCE | Admitting: Physical Therapy

## 2014-02-19 DIAGNOSIS — M25519 Pain in unspecified shoulder: Secondary | ICD-10-CM | POA: Diagnosis not present

## 2014-02-21 ENCOUNTER — Ambulatory Visit: Payer: PRIVATE HEALTH INSURANCE | Admitting: Physical Therapy

## 2014-02-21 DIAGNOSIS — M25519 Pain in unspecified shoulder: Secondary | ICD-10-CM | POA: Diagnosis not present

## 2014-02-26 ENCOUNTER — Ambulatory Visit: Payer: PRIVATE HEALTH INSURANCE | Admitting: Physical Therapy

## 2014-02-26 DIAGNOSIS — M25519 Pain in unspecified shoulder: Secondary | ICD-10-CM | POA: Diagnosis not present

## 2014-02-28 ENCOUNTER — Ambulatory Visit: Payer: PRIVATE HEALTH INSURANCE | Admitting: Physical Therapy

## 2014-02-28 DIAGNOSIS — M25519 Pain in unspecified shoulder: Secondary | ICD-10-CM | POA: Diagnosis not present

## 2014-03-05 ENCOUNTER — Ambulatory Visit: Payer: PRIVATE HEALTH INSURANCE | Admitting: Physical Therapy

## 2014-03-05 DIAGNOSIS — M25519 Pain in unspecified shoulder: Secondary | ICD-10-CM | POA: Diagnosis not present

## 2014-03-07 ENCOUNTER — Ambulatory Visit: Payer: PRIVATE HEALTH INSURANCE | Attending: Orthopedic Surgery | Admitting: Physical Therapy

## 2014-03-07 ENCOUNTER — Ambulatory Visit: Payer: PRIVATE HEALTH INSURANCE | Admitting: Physical Therapy

## 2014-03-07 DIAGNOSIS — S46212D Strain of muscle, fascia and tendon of other parts of biceps, left arm, subsequent encounter: Secondary | ICD-10-CM | POA: Diagnosis present

## 2014-03-07 DIAGNOSIS — Z9889 Other specified postprocedural states: Secondary | ICD-10-CM | POA: Insufficient documentation

## 2014-03-07 DIAGNOSIS — M25612 Stiffness of left shoulder, not elsewhere classified: Secondary | ICD-10-CM | POA: Diagnosis not present

## 2014-03-07 DIAGNOSIS — R609 Edema, unspecified: Secondary | ICD-10-CM | POA: Insufficient documentation

## 2014-03-07 DIAGNOSIS — M25512 Pain in left shoulder: Secondary | ICD-10-CM | POA: Insufficient documentation

## 2014-03-07 DIAGNOSIS — S42302D Unspecified fracture of shaft of humerus, left arm, subsequent encounter for fracture with routine healing: Secondary | ICD-10-CM | POA: Insufficient documentation

## 2014-03-13 ENCOUNTER — Ambulatory Visit: Payer: PRIVATE HEALTH INSURANCE | Admitting: Physical Therapy

## 2014-03-13 DIAGNOSIS — S42302D Unspecified fracture of shaft of humerus, left arm, subsequent encounter for fracture with routine healing: Secondary | ICD-10-CM | POA: Diagnosis not present

## 2014-03-20 ENCOUNTER — Ambulatory Visit: Payer: PRIVATE HEALTH INSURANCE | Admitting: Physical Therapy

## 2014-03-20 DIAGNOSIS — S42302D Unspecified fracture of shaft of humerus, left arm, subsequent encounter for fracture with routine healing: Secondary | ICD-10-CM | POA: Diagnosis not present

## 2014-03-27 ENCOUNTER — Ambulatory Visit: Payer: PRIVATE HEALTH INSURANCE | Admitting: Physical Therapy

## 2014-03-27 DIAGNOSIS — S42302D Unspecified fracture of shaft of humerus, left arm, subsequent encounter for fracture with routine healing: Secondary | ICD-10-CM | POA: Diagnosis not present

## 2014-04-03 ENCOUNTER — Ambulatory Visit: Payer: PRIVATE HEALTH INSURANCE | Admitting: Physical Therapy

## 2014-04-03 DIAGNOSIS — S42302D Unspecified fracture of shaft of humerus, left arm, subsequent encounter for fracture with routine healing: Secondary | ICD-10-CM | POA: Diagnosis not present

## 2014-04-08 ENCOUNTER — Ambulatory Visit: Payer: PRIVATE HEALTH INSURANCE | Attending: Orthopedic Surgery | Admitting: Physical Therapy

## 2014-04-08 DIAGNOSIS — M25612 Stiffness of left shoulder, not elsewhere classified: Secondary | ICD-10-CM | POA: Diagnosis not present

## 2014-04-08 DIAGNOSIS — M25512 Pain in left shoulder: Secondary | ICD-10-CM | POA: Insufficient documentation

## 2014-04-08 DIAGNOSIS — R609 Edema, unspecified: Secondary | ICD-10-CM | POA: Insufficient documentation

## 2014-04-08 DIAGNOSIS — S46212D Strain of muscle, fascia and tendon of other parts of biceps, left arm, subsequent encounter: Secondary | ICD-10-CM | POA: Diagnosis present

## 2014-04-08 DIAGNOSIS — S42302D Unspecified fracture of shaft of humerus, left arm, subsequent encounter for fracture with routine healing: Secondary | ICD-10-CM | POA: Insufficient documentation

## 2014-04-08 DIAGNOSIS — Z9889 Other specified postprocedural states: Secondary | ICD-10-CM | POA: Diagnosis not present

## 2014-04-10 ENCOUNTER — Ambulatory Visit: Payer: PRIVATE HEALTH INSURANCE | Admitting: Physical Therapy

## 2014-04-10 DIAGNOSIS — S42302D Unspecified fracture of shaft of humerus, left arm, subsequent encounter for fracture with routine healing: Secondary | ICD-10-CM | POA: Diagnosis not present

## 2014-04-16 ENCOUNTER — Ambulatory Visit: Payer: PRIVATE HEALTH INSURANCE | Admitting: Physical Therapy

## 2014-04-16 DIAGNOSIS — S42302D Unspecified fracture of shaft of humerus, left arm, subsequent encounter for fracture with routine healing: Secondary | ICD-10-CM | POA: Diagnosis not present

## 2014-04-18 ENCOUNTER — Ambulatory Visit: Payer: PRIVATE HEALTH INSURANCE | Admitting: Physical Therapy

## 2014-04-18 DIAGNOSIS — S42302D Unspecified fracture of shaft of humerus, left arm, subsequent encounter for fracture with routine healing: Secondary | ICD-10-CM | POA: Diagnosis not present

## 2014-04-24 ENCOUNTER — Ambulatory Visit: Payer: PRIVATE HEALTH INSURANCE | Admitting: Physical Therapy

## 2014-04-24 DIAGNOSIS — S42302D Unspecified fracture of shaft of humerus, left arm, subsequent encounter for fracture with routine healing: Secondary | ICD-10-CM | POA: Diagnosis not present

## 2014-04-29 ENCOUNTER — Ambulatory Visit: Payer: PRIVATE HEALTH INSURANCE

## 2014-04-29 DIAGNOSIS — S42302D Unspecified fracture of shaft of humerus, left arm, subsequent encounter for fracture with routine healing: Secondary | ICD-10-CM | POA: Diagnosis not present

## 2014-05-01 ENCOUNTER — Ambulatory Visit: Payer: PRIVATE HEALTH INSURANCE | Admitting: Physical Therapy

## 2014-05-06 ENCOUNTER — Ambulatory Visit: Payer: PRIVATE HEALTH INSURANCE | Admitting: Physical Therapy

## 2014-05-06 DIAGNOSIS — S42302D Unspecified fracture of shaft of humerus, left arm, subsequent encounter for fracture with routine healing: Secondary | ICD-10-CM | POA: Diagnosis not present

## 2014-05-15 ENCOUNTER — Ambulatory Visit: Payer: PRIVATE HEALTH INSURANCE | Attending: Orthopedic Surgery | Admitting: Physical Therapy

## 2014-05-15 DIAGNOSIS — S42302D Unspecified fracture of shaft of humerus, left arm, subsequent encounter for fracture with routine healing: Secondary | ICD-10-CM | POA: Insufficient documentation

## 2014-05-15 DIAGNOSIS — R609 Edema, unspecified: Secondary | ICD-10-CM | POA: Diagnosis not present

## 2014-05-15 DIAGNOSIS — Z9889 Other specified postprocedural states: Secondary | ICD-10-CM | POA: Insufficient documentation

## 2014-05-15 DIAGNOSIS — M25512 Pain in left shoulder: Secondary | ICD-10-CM | POA: Diagnosis not present

## 2014-05-15 DIAGNOSIS — M25612 Stiffness of left shoulder, not elsewhere classified: Secondary | ICD-10-CM | POA: Diagnosis not present

## 2014-05-15 DIAGNOSIS — S46212D Strain of muscle, fascia and tendon of other parts of biceps, left arm, subsequent encounter: Secondary | ICD-10-CM | POA: Insufficient documentation

## 2014-05-22 ENCOUNTER — Ambulatory Visit: Payer: PRIVATE HEALTH INSURANCE | Admitting: Physical Therapy

## 2014-05-22 DIAGNOSIS — S42302D Unspecified fracture of shaft of humerus, left arm, subsequent encounter for fracture with routine healing: Secondary | ICD-10-CM | POA: Diagnosis not present

## 2014-12-20 IMAGING — CR DG SHOULDER 1V*L*
1 series · 1 of 1 positions shown · non-contrast
Comparison: None.

CLINICAL DATA: Status post surgery.

EXAM:
PORTABLE LEFT SHOULDER - 2+ VIEW

[AP]
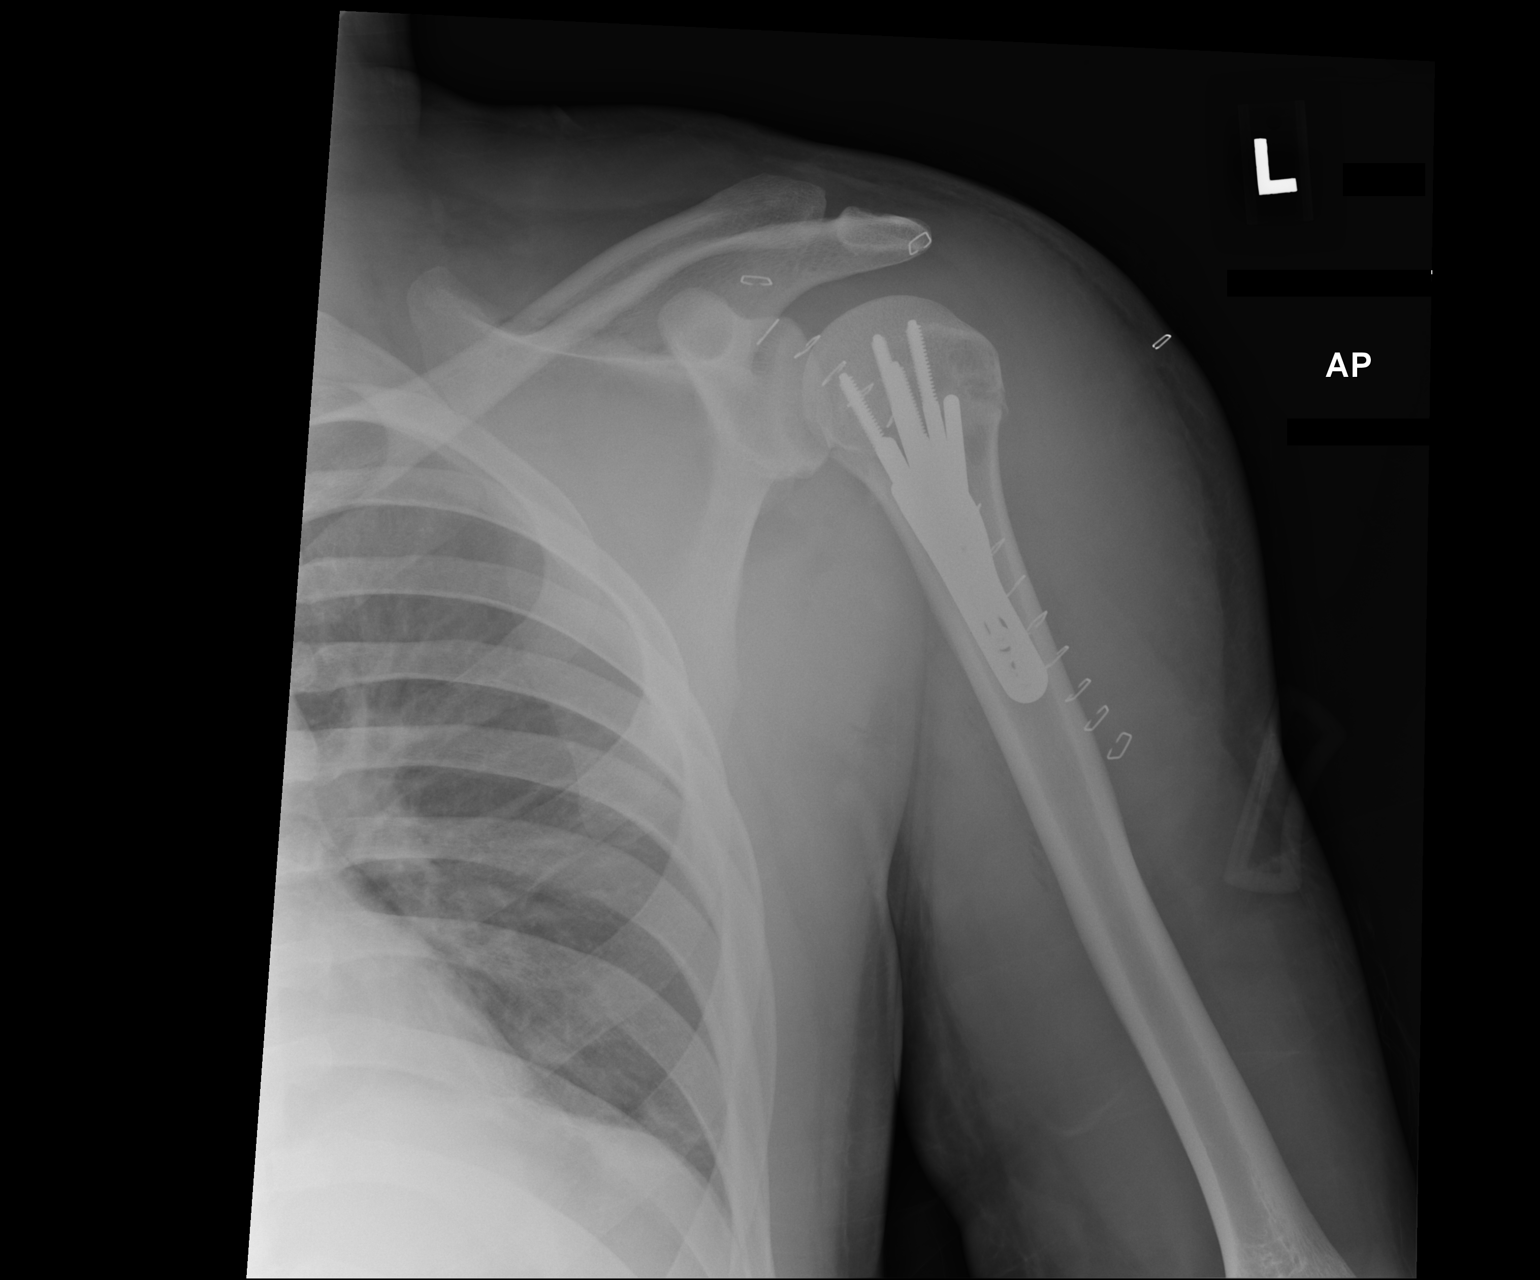

[1 of 1 positions shown; findings below may reference images not displayed]

FINDINGS: Single frontal radiograph of the left shoulder. Left humerus plate
and screw fixation of surgical neck fracture. No dislocation. No
destructive bony lesions. Shoulder soft tissue swelling with skin
staples.
IMPRESSION: Status post ORIF of left surgical neck humerus fracture.

  By: Nazareth Jumper
